# Patient Record
Sex: Male | Born: 1950 | Race: White | Hispanic: No | Marital: Single | Smoking: Current every day smoker
Health system: Southern US, Community
[De-identification: ages and names within clinical notes are randomized; demographics above are authoritative.]

## PROBLEM LIST (undated history)

## (undated) DIAGNOSIS — G4733 Obstructive sleep apnea (adult) (pediatric): Secondary | ICD-10-CM

## (undated) DIAGNOSIS — J45909 Unspecified asthma, uncomplicated: Secondary | ICD-10-CM

## (undated) DIAGNOSIS — N529 Male erectile dysfunction, unspecified: Secondary | ICD-10-CM

## (undated) DIAGNOSIS — M4800 Spinal stenosis, site unspecified: Secondary | ICD-10-CM

## (undated) DIAGNOSIS — I10 Essential (primary) hypertension: Secondary | ICD-10-CM

## (undated) DIAGNOSIS — M1990 Unspecified osteoarthritis, unspecified site: Secondary | ICD-10-CM

## (undated) DIAGNOSIS — F329 Major depressive disorder, single episode, unspecified: Secondary | ICD-10-CM

## (undated) DIAGNOSIS — F32A Depression, unspecified: Secondary | ICD-10-CM

## (undated) HISTORY — PX: INGUINAL HERNIA REPAIR: SUR1180

## (undated) HISTORY — DX: Depression, unspecified: F32.A

## (undated) HISTORY — DX: Spinal stenosis, site unspecified: M48.00

## (undated) HISTORY — DX: Male erectile dysfunction, unspecified: N52.9

## (undated) HISTORY — DX: Unspecified asthma, uncomplicated: J45.909

## (undated) HISTORY — DX: Obstructive sleep apnea (adult) (pediatric): G47.33

## (undated) HISTORY — DX: Essential (primary) hypertension: I10

## (undated) HISTORY — DX: Major depressive disorder, single episode, unspecified: F32.9

## (undated) HISTORY — PX: APPENDECTOMY: SHX54

---

## 2009-11-30 ENCOUNTER — Ambulatory Visit (HOSPITAL_COMMUNITY): Admission: RE | Admit: 2009-11-30 | Discharge: 2009-11-30 | Payer: Self-pay | Admitting: Family Medicine

## 2013-01-14 ENCOUNTER — Encounter: Payer: Self-pay | Admitting: Cardiology

## 2013-01-14 ENCOUNTER — Encounter: Payer: Self-pay | Admitting: *Deleted

## 2013-01-14 DIAGNOSIS — J45909 Unspecified asthma, uncomplicated: Secondary | ICD-10-CM | POA: Insufficient documentation

## 2013-01-14 DIAGNOSIS — G4733 Obstructive sleep apnea (adult) (pediatric): Secondary | ICD-10-CM | POA: Insufficient documentation

## 2013-01-14 DIAGNOSIS — M4800 Spinal stenosis, site unspecified: Secondary | ICD-10-CM | POA: Insufficient documentation

## 2013-01-14 DIAGNOSIS — N529 Male erectile dysfunction, unspecified: Secondary | ICD-10-CM | POA: Insufficient documentation

## 2013-01-14 DIAGNOSIS — R195 Other fecal abnormalities: Secondary | ICD-10-CM | POA: Insufficient documentation

## 2013-01-14 DIAGNOSIS — F329 Major depressive disorder, single episode, unspecified: Secondary | ICD-10-CM | POA: Insufficient documentation

## 2013-01-14 DIAGNOSIS — I10 Essential (primary) hypertension: Secondary | ICD-10-CM | POA: Insufficient documentation

## 2013-01-15 ENCOUNTER — Encounter: Payer: Self-pay | Admitting: Cardiology

## 2013-01-15 ENCOUNTER — Encounter (INDEPENDENT_AMBULATORY_CARE_PROVIDER_SITE_OTHER): Payer: Self-pay

## 2013-01-15 ENCOUNTER — Ambulatory Visit (INDEPENDENT_AMBULATORY_CARE_PROVIDER_SITE_OTHER): Payer: BC Managed Care – PPO | Admitting: Cardiology

## 2013-01-15 VITALS — BP 134/90 | HR 82 | Ht 72.0 in | Wt 242.0 lb

## 2013-01-15 DIAGNOSIS — G4733 Obstructive sleep apnea (adult) (pediatric): Secondary | ICD-10-CM

## 2013-01-15 DIAGNOSIS — I10 Essential (primary) hypertension: Secondary | ICD-10-CM

## 2013-01-15 DIAGNOSIS — E669 Obesity, unspecified: Secondary | ICD-10-CM

## 2013-01-15 NOTE — Patient Instructions (Signed)
Your physician wants you to follow-up in: 6 months with Dr. Turner. You will receive a reminder letter in the mail two months in advance. If you don't receive a letter, please call our office to schedule the follow-up appointment.  

## 2013-01-15 NOTE — Progress Notes (Signed)
  [address] [place], [place]  [address] Phone: [phone] Fax:  [phone]  Date:  01/15/2013   ID:  Kevin Bartlett, DOB August 27, 1950, MRN [id number]  PCP:  Thora Lance, MD  Cardiologist:  Armanda Magic, MD     History of Present Illness: Kevin Bartlett is a 62 y.o. male with a history of HTN and obesity who recently complained to his PCP that he was having problems with loud snoring and witnessed apnea and daytime sleepiness.  He underwent PSG showing severe OSA with an AHI of 37/hr and underwent CPAP titration to 8cm H2O.   His last download showed an AHI of 0.7/hr and 97% compliant in using more than 4 hours nightly.  He is doing very well with his CPAP therapy.  He tolerates the mask and pressure well.  He uses a nasal pillow mask.  He feels the pressure is adequate.  Since going on the CPAP he has had some improvement in his snoring and apnea and daytime sleepiness.     Wt Readings from Last 3 Encounters:  01/15/13 242 lb (109.77 kg)     Past Medical History  Diagnosis Date  . HTN (hypertension)   . Erectile dysfunction `  . OSA (obstructive sleep apnea)   . Guaiac positive stools   . Asthma   . Depression   . Spinal stenosis   . Obesity (BMI 30-39.9)     Current Outpatient Prescriptions  Medication Sig Dispense Refill  . amitriptyline (ELAVIL) 75 MG tablet Take by mouth at bedtime.      Marland Kitchen aspirin 81 MG tablet Take 81 mg by mouth daily.      Marland Kitchen diltiazem (TIAZAC) 180 MG 24 hr capsule Take 180 mg by mouth daily.      Marland Kitchen HYDROcodone-acetaminophen (NORCO/VICODIN) 5-325 MG per tablet Take 1 tablet by mouth every 6 (six) hours as needed for pain.       No current facility-administered medications for this visit.    Allergies:   Allergies not on file  Social History:  The patient  reports that he quit smoking about 10 years ago. He does not have any smokeless tobacco history on file. He reports that he does not use illicit drugs.   Family History:  The  patient's family history includes Heart disease in his father.   ROS:  Please see the history of present illness.      All other systems reviewed and negative.   PHYSICAL EXAM: VS:  Ht 6' (1.829 m)  Wt 242 lb (109.77 kg)  BMI 32.81 kg/m2 Well nourished, well developed, in no acute distress HEENT: normal Neck: no JVD Cardiac:  normal S1, S2; RRR; no murmur Lungs:  clear to auscultation bilaterally, no wheezing, rhonchi or rales Abd: soft, nontender, no hepatomegaly Ext: no edema Skin: warm and dry Neuro:  CNs 2-12 intact, no focal abnormalities noted       ASSESSMENT AND PLAN:  1. Severe OSA on CPAP tolerating well with improvement in symptoms of daytime fatigue and snoring.  - continue CPAP at current settings 2. Obesity  - I have encouraged him to exercise daily for at least 30 minutes and watch his portion controls and follow a low fat low carb diet 3. HTN  - continue Quinapril and Diltiazem   Followup with me in 6 months  Signed, Armanda Magic, MD 01/15/2013 10:36 AM

## 2013-06-30 ENCOUNTER — Encounter: Payer: Self-pay | Admitting: Cardiology

## 2013-07-07 ENCOUNTER — Encounter: Payer: Self-pay | Admitting: Cardiology

## 2015-05-02 DIAGNOSIS — I10 Essential (primary) hypertension: Secondary | ICD-10-CM | POA: Diagnosis not present

## 2015-05-02 DIAGNOSIS — Z1389 Encounter for screening for other disorder: Secondary | ICD-10-CM | POA: Diagnosis not present

## 2015-05-02 DIAGNOSIS — G4733 Obstructive sleep apnea (adult) (pediatric): Secondary | ICD-10-CM | POA: Diagnosis not present

## 2015-05-02 DIAGNOSIS — E781 Pure hyperglyceridemia: Secondary | ICD-10-CM | POA: Diagnosis not present

## 2015-05-02 DIAGNOSIS — Z23 Encounter for immunization: Secondary | ICD-10-CM | POA: Diagnosis not present

## 2015-05-02 DIAGNOSIS — Z720 Tobacco use: Secondary | ICD-10-CM | POA: Diagnosis not present

## 2015-05-02 DIAGNOSIS — M5416 Radiculopathy, lumbar region: Secondary | ICD-10-CM | POA: Diagnosis not present

## 2015-05-02 DIAGNOSIS — N529 Male erectile dysfunction, unspecified: Secondary | ICD-10-CM | POA: Diagnosis not present

## 2015-07-18 DIAGNOSIS — M792 Neuralgia and neuritis, unspecified: Secondary | ICD-10-CM | POA: Diagnosis not present

## 2015-07-20 DIAGNOSIS — G5600 Carpal tunnel syndrome, unspecified upper limb: Secondary | ICD-10-CM | POA: Diagnosis not present

## 2015-07-20 DIAGNOSIS — G629 Polyneuropathy, unspecified: Secondary | ICD-10-CM | POA: Diagnosis not present

## 2015-07-27 DIAGNOSIS — M19042 Primary osteoarthritis, left hand: Secondary | ICD-10-CM | POA: Diagnosis not present

## 2015-07-27 DIAGNOSIS — M19041 Primary osteoarthritis, right hand: Secondary | ICD-10-CM | POA: Diagnosis not present

## 2015-07-27 DIAGNOSIS — M720 Palmar fascial fibromatosis [Dupuytren]: Secondary | ICD-10-CM | POA: Diagnosis not present

## 2015-07-27 DIAGNOSIS — G5603 Carpal tunnel syndrome, bilateral upper limbs: Secondary | ICD-10-CM | POA: Diagnosis not present

## 2015-07-27 DIAGNOSIS — M180 Bilateral primary osteoarthritis of first carpometacarpal joints: Secondary | ICD-10-CM | POA: Diagnosis not present

## 2015-08-10 DIAGNOSIS — M19041 Primary osteoarthritis, right hand: Secondary | ICD-10-CM | POA: Diagnosis not present

## 2015-08-10 DIAGNOSIS — M19042 Primary osteoarthritis, left hand: Secondary | ICD-10-CM | POA: Diagnosis not present

## 2015-08-10 DIAGNOSIS — M180 Bilateral primary osteoarthritis of first carpometacarpal joints: Secondary | ICD-10-CM | POA: Diagnosis not present

## 2015-08-10 DIAGNOSIS — M720 Palmar fascial fibromatosis [Dupuytren]: Secondary | ICD-10-CM | POA: Diagnosis not present

## 2015-08-10 DIAGNOSIS — G5603 Carpal tunnel syndrome, bilateral upper limbs: Secondary | ICD-10-CM | POA: Diagnosis not present

## 2015-09-12 DIAGNOSIS — G5603 Carpal tunnel syndrome, bilateral upper limbs: Secondary | ICD-10-CM | POA: Diagnosis not present

## 2015-09-12 DIAGNOSIS — M720 Palmar fascial fibromatosis [Dupuytren]: Secondary | ICD-10-CM | POA: Diagnosis not present

## 2015-10-31 DIAGNOSIS — M5416 Radiculopathy, lumbar region: Secondary | ICD-10-CM | POA: Diagnosis not present

## 2015-10-31 DIAGNOSIS — R634 Abnormal weight loss: Secondary | ICD-10-CM | POA: Diagnosis not present

## 2015-10-31 DIAGNOSIS — Z720 Tobacco use: Secondary | ICD-10-CM | POA: Diagnosis not present

## 2015-10-31 DIAGNOSIS — G4733 Obstructive sleep apnea (adult) (pediatric): Secondary | ICD-10-CM | POA: Diagnosis not present

## 2015-10-31 DIAGNOSIS — E781 Pure hyperglyceridemia: Secondary | ICD-10-CM | POA: Diagnosis not present

## 2015-10-31 DIAGNOSIS — N529 Male erectile dysfunction, unspecified: Secondary | ICD-10-CM | POA: Diagnosis not present

## 2015-10-31 DIAGNOSIS — I10 Essential (primary) hypertension: Secondary | ICD-10-CM | POA: Diagnosis not present

## 2015-12-20 ENCOUNTER — Encounter: Payer: Self-pay | Admitting: Cardiology

## 2015-12-21 ENCOUNTER — Encounter: Payer: Self-pay | Admitting: Cardiology

## 2015-12-21 ENCOUNTER — Ambulatory Visit (INDEPENDENT_AMBULATORY_CARE_PROVIDER_SITE_OTHER): Payer: Medicare Other | Admitting: Cardiology

## 2015-12-21 VITALS — BP 122/64 | HR 76 | Ht 72.0 in | Wt 194.6 lb

## 2015-12-21 DIAGNOSIS — I10 Essential (primary) hypertension: Secondary | ICD-10-CM

## 2015-12-21 DIAGNOSIS — G4733 Obstructive sleep apnea (adult) (pediatric): Secondary | ICD-10-CM

## 2015-12-21 DIAGNOSIS — E669 Obesity, unspecified: Secondary | ICD-10-CM

## 2015-12-21 NOTE — Progress Notes (Signed)
Cardiology Office Note    Date:  12/21/2015   ID:  [name], DOB 1950/08/23, MRN [id number]  PCP:  Thora Lance, MD  Cardiologist:  Armanda Magic, MD   Chief Complaint  Patient presents with  . Sleep Apnea  . Hypertension    History of Present Illness:  Kevin Bartlett is a 65 y.o. male with a history of HTN, OSA on CPAP and obesity.  He has severe OSA with an AHI of 37/hr and is on CPAP at 8cm H2O.   He is doing very well with his CPAP therapy.  He tolerates the nasal pillow mask and pressure well.   He feels the pressure is adequate.  Since going on the CPAP he has had some improvement in his snoring and apnea and daytime sleepiness.  He has not been very compliant with his device.  When he goes on trips he does not take it with him.      Past Medical History:  Diagnosis Date  . Asthma   . Depression   . Erectile dysfunction `  . Guaiac positive stools   . HTN (hypertension)   . Obesity (BMI 30-39.9)   . OSA (obstructive sleep apnea)   . Spinal stenosis     Past Surgical History:  Procedure Laterality Date  . APPENDECTOMY    . INGUINAL HERNIA REPAIR Bilateral     Current Medications: Outpatient Medications Prior to Visit  Medication Sig Dispense Refill  . amitriptyline (ELAVIL) 75 MG tablet Take by mouth at bedtime.    Marland Kitchen aspirin 81 MG tablet Take 81 mg by mouth daily.    Marland Kitchen diltiazem (TIAZAC) 180 MG 24 hr capsule Take 180 mg by mouth daily.    Marland Kitchen HYDROcodone-acetaminophen (NORCO/VICODIN) 5-325 MG per tablet Take 1 tablet by mouth every 6 (six) hours as needed for pain.    . meloxicam (MOBIC) 7.5 MG tablet Take 7.5 mg by mouth daily.    . quinapril (ACCUPRIL) 10 MG tablet Take 10 mg by mouth at bedtime.     No facility-administered medications prior to visit.      Allergies:   Review of patient's allergies indicates no known allergies.   Social History   Social History  . Marital status: Single    Spouse name: N/A  . Number of children: N/A  .  Years of education: N/A   Social History Main Topics  . Smoking status: Former Smoker    Types: Cigars    Quit date: 01/16/2003  . Smokeless tobacco: Never Used  . Alcohol use None  . Drug use: No  . Sexual activity: Not Asked   Other Topics Concern  . None   Social History Narrative  . None     Family History:  The patient's family history includes Heart disease in his father.   ROS:   Please see the history of present illness.    ROS All other systems reviewed and are negative.  No flowsheet data found.     PHYSICAL EXAM:   VS:  BP 122/64   Pulse 76   Ht 6' (1.829 m)   Wt 194 lb 9.6 oz (88.3 kg)   SpO2 98%   BMI 26.39 kg/m    GEN: Well nourished, well developed, in no acute distress  HEENT: normal  Neck: no JVD, carotid bruits, or masses Cardiac: RRR; no murmurs, rubs, or gallops,no edema.  Intact distal pulses bilaterally.  Respiratory:  clear to auscultation bilaterally, normal work of breathing  GI: soft, nontender, nondistended, + BS MS: no deformity or atrophy  Skin: warm and dry, no rash Neuro:  Alert and Oriented x 3, Strength and sensation are intact Psych: euthymic mood, full affect  Wt Readings from Last 3 Encounters:  12/21/15 194 lb 9.6 oz (88.3 kg)  01/15/13 242 lb (109.8 kg)      Studies/Labs Reviewed:   EKG:  EKG is not ordered today.    Recent Labs: No results found for requested labs within last 8760 hours.   Lipid Panel No results found for: CHOL, TRIG, HDL, CHOLHDL, VLDL, LDLCALC, LDLDIRECT  Additional studies/ records that were reviewed today include:  CPAP download    ASSESSMENT:    1. OSA (obstructive sleep apnea)   2. Essential hypertension   3. Obesity (BMI 30-39.9)      PLAN:  In order of problems listed above:  OSA - the patient is tolerating PAP therapy well without any problems. The PAP download was reviewed today and showed an AHI of 0.3/hr on 8 cm H2O with 59% compliance in using more than 4 hours nightly.   The patient has been using and benefiting from CPAP use and will continue to benefit from therapy. I have encouraged him to be more compliant with his device. He has lost over 30lbs so I will get a 2 week autotitration from 4 to 18cm H2o and see if we can decrease his pressure. HTN - BP controlled on current meds.  Continue ACE I/CCB.   Obesity - I have encouraged him to continue exercising at the gym.  He has lost over 30lbs since he      Medication Adjustments/Labs and Tests Ordered: Current medicines are reviewed at length with the patient today.  Concerns regarding medicines are outlined above.  Medication changes, Labs and Tests ordered today are listed in the Patient Instructions below.  There are no Patient Instructions on file for this visit.   Signed, Armanda Magicraci Annah Jasko, MD  12/21/2015 3:53 PM    [facility] [address], [place], [place]  [address] Phone: [phone]; Fax: [phone]

## 2015-12-21 NOTE — Patient Instructions (Signed)
Medication Instructions:  Your physician recommends that you continue on your current medications as directed. Please refer to the Current Medication list given to you today.   Labwork: None  Testing/Procedures: Dr. Mayford Knifeurner has ordered for you a 2 week CPAP auto-titration.  Follow-Up: Your physician wants you to follow-up in: 1 year with Dr. Mayford Knifeurner. You will receive a reminder letter in the mail two months in advance. If you don't receive a letter, please call our office to schedule the follow-up appointment.   Any Other Special Instructions Will Be Listed Below (If Applicable).     If you need a refill on your cardiac medications before your next appointment, please call your pharmacy.

## 2016-03-14 ENCOUNTER — Other Ambulatory Visit: Payer: Self-pay | Admitting: Orthopedic Surgery

## 2016-03-14 DIAGNOSIS — M720 Palmar fascial fibromatosis [Dupuytren]: Secondary | ICD-10-CM | POA: Diagnosis not present

## 2016-03-14 DIAGNOSIS — G5603 Carpal tunnel syndrome, bilateral upper limbs: Secondary | ICD-10-CM | POA: Diagnosis not present

## 2016-03-20 ENCOUNTER — Encounter (HOSPITAL_BASED_OUTPATIENT_CLINIC_OR_DEPARTMENT_OTHER): Payer: Self-pay | Admitting: *Deleted

## 2016-03-20 ENCOUNTER — Encounter (HOSPITAL_BASED_OUTPATIENT_CLINIC_OR_DEPARTMENT_OTHER)
Admission: RE | Admit: 2016-03-20 | Discharge: 2016-03-20 | Disposition: A | Discharge status: Home / Self Care | Payer: Medicare Other | Source: Ambulatory Visit | Attending: Orthopedic Surgery | Admitting: Orthopedic Surgery

## 2016-03-20 DIAGNOSIS — G4733 Obstructive sleep apnea (adult) (pediatric): Secondary | ICD-10-CM | POA: Diagnosis not present

## 2016-03-20 DIAGNOSIS — Z79899 Other long term (current) drug therapy: Secondary | ICD-10-CM | POA: Diagnosis not present

## 2016-03-20 DIAGNOSIS — I10 Essential (primary) hypertension: Secondary | ICD-10-CM | POA: Diagnosis not present

## 2016-03-20 DIAGNOSIS — J45909 Unspecified asthma, uncomplicated: Secondary | ICD-10-CM | POA: Diagnosis not present

## 2016-03-20 DIAGNOSIS — Z6827 Body mass index (BMI) 27.0-27.9, adult: Secondary | ICD-10-CM | POA: Diagnosis not present

## 2016-03-20 DIAGNOSIS — Z87891 Personal history of nicotine dependence: Secondary | ICD-10-CM | POA: Diagnosis not present

## 2016-03-20 DIAGNOSIS — M720 Palmar fascial fibromatosis [Dupuytren]: Secondary | ICD-10-CM | POA: Diagnosis not present

## 2016-03-20 DIAGNOSIS — E669 Obesity, unspecified: Secondary | ICD-10-CM | POA: Diagnosis not present

## 2016-03-20 DIAGNOSIS — G5603 Carpal tunnel syndrome, bilateral upper limbs: Secondary | ICD-10-CM | POA: Diagnosis not present

## 2016-03-20 DIAGNOSIS — F329 Major depressive disorder, single episode, unspecified: Secondary | ICD-10-CM | POA: Diagnosis not present

## 2016-03-20 NOTE — Progress Notes (Signed)
   03/20/16 1006  OBSTRUCTIVE SLEEP APNEA  Have you ever been diagnosed with sleep apnea through a sleep study? Yes  If yes, do you have and use a CPAP or BPAP machine every night? 1  Do you know the presssure settings on your maching? Yes  Do you snore loudly (loud enough to be heard through closed doors)?  1  Do you often feel tired, fatigued, or sleepy during the daytime (such as falling asleep during driving or talking to someone)? 0  Has anyone observed you stop breathing during your sleep? 0  Do you have, or are you being treated for high blood pressure? 1  BMI more than 35 kg/m2? 0  Age > 50 (1-yes) 1  Neck circumference greater than:Male 16 inches or larger, Male 17inches or larger? 0  Male Gender (Yes=1) 1  Obstructive Sleep Apnea Score 4

## 2016-03-22 ENCOUNTER — Ambulatory Visit (HOSPITAL_BASED_OUTPATIENT_CLINIC_OR_DEPARTMENT_OTHER): Payer: Medicare Other | Admitting: Anesthesiology

## 2016-03-22 ENCOUNTER — Encounter (HOSPITAL_BASED_OUTPATIENT_CLINIC_OR_DEPARTMENT_OTHER)
Admission: RE | Disposition: A | Discharge status: Home / Self Care | Payer: Self-pay | Source: Ambulatory Visit | Attending: Orthopedic Surgery

## 2016-03-22 ENCOUNTER — Encounter (HOSPITAL_BASED_OUTPATIENT_CLINIC_OR_DEPARTMENT_OTHER): Payer: Self-pay | Admitting: Anesthesiology

## 2016-03-22 ENCOUNTER — Ambulatory Visit (HOSPITAL_BASED_OUTPATIENT_CLINIC_OR_DEPARTMENT_OTHER)
Admission: RE | Admit: 2016-03-22 | Discharge: 2016-03-22 | Disposition: A | Discharge status: Home / Self Care | Payer: Medicare Other | Source: Ambulatory Visit | Attending: Orthopedic Surgery | Admitting: Orthopedic Surgery

## 2016-03-22 DIAGNOSIS — G4733 Obstructive sleep apnea (adult) (pediatric): Secondary | ICD-10-CM | POA: Insufficient documentation

## 2016-03-22 DIAGNOSIS — I10 Essential (primary) hypertension: Secondary | ICD-10-CM | POA: Diagnosis not present

## 2016-03-22 DIAGNOSIS — G5603 Carpal tunnel syndrome, bilateral upper limbs: Secondary | ICD-10-CM | POA: Insufficient documentation

## 2016-03-22 DIAGNOSIS — Z79899 Other long term (current) drug therapy: Secondary | ICD-10-CM | POA: Insufficient documentation

## 2016-03-22 DIAGNOSIS — J45909 Unspecified asthma, uncomplicated: Secondary | ICD-10-CM | POA: Diagnosis not present

## 2016-03-22 DIAGNOSIS — Z6827 Body mass index (BMI) 27.0-27.9, adult: Secondary | ICD-10-CM | POA: Diagnosis not present

## 2016-03-22 DIAGNOSIS — M720 Palmar fascial fibromatosis [Dupuytren]: Secondary | ICD-10-CM | POA: Insufficient documentation

## 2016-03-22 DIAGNOSIS — E669 Obesity, unspecified: Secondary | ICD-10-CM | POA: Insufficient documentation

## 2016-03-22 DIAGNOSIS — Z87891 Personal history of nicotine dependence: Secondary | ICD-10-CM | POA: Insufficient documentation

## 2016-03-22 DIAGNOSIS — G5602 Carpal tunnel syndrome, left upper limb: Secondary | ICD-10-CM | POA: Diagnosis not present

## 2016-03-22 DIAGNOSIS — F329 Major depressive disorder, single episode, unspecified: Secondary | ICD-10-CM | POA: Diagnosis not present

## 2016-03-22 HISTORY — PX: CARPAL TUNNEL RELEASE: SHX101

## 2016-03-22 HISTORY — PX: FASCIECTOMY: SHX6525

## 2016-03-22 HISTORY — DX: Unspecified osteoarthritis, unspecified site: M19.90

## 2016-03-22 SURGERY — CARPAL TUNNEL RELEASE
Anesthesia: General | Site: Wrist | Laterality: Left

## 2016-03-22 MED ORDER — BUPIVACAINE HCL (PF) 0.25 % IJ SOLN
INTRAMUSCULAR | Status: DC | PRN
  Administered 2016-03-22: 10 mL

## 2016-03-22 MED ORDER — PROMETHAZINE HCL 25 MG/ML IJ SOLN: 6.2500 mg | INTRAMUSCULAR | Status: DC | PRN

## 2016-03-22 MED ORDER — FENTANYL CITRATE (PF) 100 MCG/2ML IJ SOLN
INTRAMUSCULAR | Status: DC | PRN
  Administered 2016-03-22: 50 ug via INTRAVENOUS
  Administered 2016-03-22: 100 ug via INTRAVENOUS
  Administered 2016-03-22: 50 ug via INTRAVENOUS

## 2016-03-22 MED ORDER — PROPOFOL 10 MG/ML IV BOLUS
INTRAVENOUS | Status: AC
  Filled 2016-03-22: qty 20

## 2016-03-22 MED ORDER — LIDOCAINE 2% (20 MG/ML) 5 ML SYRINGE
INTRAMUSCULAR | Status: AC
  Filled 2016-03-22: qty 5

## 2016-03-22 MED ORDER — LIDOCAINE HCL (CARDIAC) 20 MG/ML IV SOLN
INTRAVENOUS | Status: DC | PRN
  Administered 2016-03-22: 30 mg via INTRAVENOUS

## 2016-03-22 MED ORDER — PROPOFOL 10 MG/ML IV BOLUS
INTRAVENOUS | Status: DC | PRN
  Administered 2016-03-22: 200 mg via INTRAVENOUS

## 2016-03-22 MED ORDER — MIDAZOLAM HCL 5 MG/5ML IJ SOLN
INTRAMUSCULAR | Status: DC | PRN
  Administered 2016-03-22: 2 mg via INTRAVENOUS

## 2016-03-22 MED ORDER — ONDANSETRON HCL 4 MG/2ML IJ SOLN
INTRAMUSCULAR | Status: DC | PRN
  Administered 2016-03-22: 4 mg via INTRAVENOUS

## 2016-03-22 MED ORDER — LACTATED RINGERS IV SOLN
INTRAVENOUS | Status: DC
  Administered 2016-03-22: 08:00:00 via INTRAVENOUS

## 2016-03-22 MED ORDER — CEFAZOLIN SODIUM-DEXTROSE 2-4 GM/100ML-% IV SOLN
INTRAVENOUS | Status: AC
  Filled 2016-03-22: qty 100

## 2016-03-22 MED ORDER — CEFAZOLIN SODIUM-DEXTROSE 2-4 GM/100ML-% IV SOLN
2.0000 g | INTRAVENOUS | Status: AC
  Administered 2016-03-22: 2 g via INTRAVENOUS

## 2016-03-22 MED ORDER — EPHEDRINE SULFATE 50 MG/ML IJ SOLN
INTRAMUSCULAR | Status: DC | PRN
  Administered 2016-03-22: 10 mg via INTRAVENOUS

## 2016-03-22 MED ORDER — DEXAMETHASONE SODIUM PHOSPHATE 10 MG/ML IJ SOLN
INTRAMUSCULAR | Status: AC
  Filled 2016-03-22: qty 1

## 2016-03-22 MED ORDER — LIDOCAINE HCL (PF) 0.5 % IJ SOLN
INTRAMUSCULAR | Status: AC
  Filled 2016-03-22: qty 100

## 2016-03-22 MED ORDER — MIDAZOLAM HCL 2 MG/2ML IJ SOLN: 1.0000 mg | INTRAMUSCULAR | Status: DC | PRN

## 2016-03-22 MED ORDER — KETOROLAC TROMETHAMINE 30 MG/ML IJ SOLN
INTRAMUSCULAR | Status: DC | PRN
  Administered 2016-03-22: 30 mg via INTRAVENOUS

## 2016-03-22 MED ORDER — FENTANYL CITRATE (PF) 100 MCG/2ML IJ SOLN
INTRAMUSCULAR | Status: AC
  Filled 2016-03-22: qty 2

## 2016-03-22 MED ORDER — PHENYLEPHRINE HCL 10 MG/ML IJ SOLN
INTRAMUSCULAR | Status: DC | PRN
  Administered 2016-03-22: 40 ug via INTRAVENOUS

## 2016-03-22 MED ORDER — FENTANYL CITRATE (PF) 100 MCG/2ML IJ SOLN: 50.0000 ug | INTRAMUSCULAR | Status: DC | PRN

## 2016-03-22 MED ORDER — DEXAMETHASONE SODIUM PHOSPHATE 4 MG/ML IJ SOLN
INTRAMUSCULAR | Status: DC | PRN
  Administered 2016-03-22: 10 mg via INTRAVENOUS

## 2016-03-22 MED ORDER — MIDAZOLAM HCL 2 MG/2ML IJ SOLN
INTRAMUSCULAR | Status: AC
  Filled 2016-03-22: qty 2

## 2016-03-22 MED ORDER — THROMBIN 20000 UNITS EX KIT
PACK | CUTANEOUS | Status: DC | PRN
  Administered 2016-03-22: 5000 [IU] via TOPICAL

## 2016-03-22 MED ORDER — CHLORHEXIDINE GLUCONATE 4 % EX LIQD: 60.0000 mL | Freq: Once | CUTANEOUS | Status: DC

## 2016-03-22 MED ORDER — HYDROMORPHONE HCL 1 MG/ML IJ SOLN: 0.2500 mg | INTRAMUSCULAR | Status: DC | PRN

## 2016-03-22 MED ORDER — PROPOFOL 500 MG/50ML IV EMUL
INTRAVENOUS | Status: AC
  Filled 2016-03-22: qty 50

## 2016-03-22 MED ORDER — ONDANSETRON HCL 4 MG/2ML IJ SOLN
INTRAMUSCULAR | Status: AC
  Filled 2016-03-22: qty 2

## 2016-03-22 MED ORDER — SCOPOLAMINE 1 MG/3DAYS TD PT72
1.0000 | MEDICATED_PATCH | Freq: Once | TRANSDERMAL | Status: DC | PRN
Start: 2016-03-22 — End: 2016-03-22

## 2016-03-22 MED ORDER — LACTATED RINGERS IV SOLN
INTRAVENOUS | Status: DC | PRN
  Administered 2016-03-22 (×2): via INTRAVENOUS

## 2016-03-22 MED ORDER — KETOROLAC TROMETHAMINE 30 MG/ML IJ SOLN
INTRAMUSCULAR | Status: AC
  Filled 2016-03-22: qty 1

## 2016-03-22 SURGICAL SUPPLY — 44 items
BLADE MINI RND TIP GREEN BEAV (BLADE) ×4 IMPLANT
BLADE SURG 15 STRL LF DISP TIS (BLADE) ×2 IMPLANT
BLADE SURG 15 STRL SS (BLADE) ×2
BNDG COHESIVE 3X5 TAN STRL LF (GAUZE/BANDAGES/DRESSINGS) ×4 IMPLANT
BNDG ESMARK 4X9 LF (GAUZE/BANDAGES/DRESSINGS) ×4 IMPLANT
BNDG GAUZE ELAST 4 BULKY (GAUZE/BANDAGES/DRESSINGS) ×4 IMPLANT
CHLORAPREP W/TINT 26ML (MISCELLANEOUS) ×4 IMPLANT
CORDS BIPOLAR (ELECTRODE) ×4 IMPLANT
COVER BACK TABLE 60X90IN (DRAPES) ×4 IMPLANT
COVER MAYO STAND STRL (DRAPES) ×4 IMPLANT
CUFF TOURNIQUET SINGLE 18IN (TOURNIQUET CUFF) ×4 IMPLANT
DECANTER SPIKE VIAL GLASS SM (MISCELLANEOUS) IMPLANT
DRAPE EXTREMITY T 121X128X90 (DRAPE) ×4 IMPLANT
DRAPE SURG 17X23 STRL (DRAPES) ×4 IMPLANT
DRSG PAD ABDOMINAL 8X10 ST (GAUZE/BANDAGES/DRESSINGS) ×4 IMPLANT
GAUZE SPONGE 4X4 12PLY STRL (GAUZE/BANDAGES/DRESSINGS) ×4 IMPLANT
GAUZE XEROFORM 1X8 LF (GAUZE/BANDAGES/DRESSINGS) ×4 IMPLANT
GLOVE BIOGEL PI IND STRL 7.0 (GLOVE) ×2 IMPLANT
GLOVE BIOGEL PI IND STRL 8.5 (GLOVE) ×2 IMPLANT
GLOVE BIOGEL PI INDICATOR 7.0 (GLOVE) ×2
GLOVE BIOGEL PI INDICATOR 8.5 (GLOVE) ×2
GLOVE SURG ORTHO 8.0 STRL STRW (GLOVE) ×4 IMPLANT
GLOVE SURG SYN 7.5  E (GLOVE) ×2
GLOVE SURG SYN 7.5 E (GLOVE) ×2 IMPLANT
GOWN STRL REUS W/ TWL LRG LVL3 (GOWN DISPOSABLE) ×2 IMPLANT
GOWN STRL REUS W/TWL LRG LVL3 (GOWN DISPOSABLE) ×2
GOWN STRL REUS W/TWL XL LVL3 (GOWN DISPOSABLE) ×4 IMPLANT
LOOP VESSEL MAXI BLUE (MISCELLANEOUS) ×4 IMPLANT
NEEDLE PRECISIONGLIDE 27X1.5 (NEEDLE) ×4 IMPLANT
NS IRRIG 1000ML POUR BTL (IV SOLUTION) ×4 IMPLANT
PACK BASIN DAY SURGERY FS (CUSTOM PROCEDURE TRAY) ×4 IMPLANT
PAD CAST 3X4 CTTN HI CHSV (CAST SUPPLIES) ×2 IMPLANT
PADDING CAST COTTON 3X4 STRL (CAST SUPPLIES) ×2
SLEEVE SCD COMPRESS KNEE MED (MISCELLANEOUS) ×4 IMPLANT
SPLINT PLASTER CAST XFAST 3X15 (CAST SUPPLIES) IMPLANT
SPLINT PLASTER XTRA FASTSET 3X (CAST SUPPLIES)
STOCKINETTE 4X48 STRL (DRAPES) ×4 IMPLANT
SUT ETHILON 4 0 PS 2 18 (SUTURE) ×4 IMPLANT
SUT SILK 2 0 FS (SUTURE) ×4 IMPLANT
SUT VICRYL 4-0 PS2 18IN ABS (SUTURE) IMPLANT
SYR BULB 3OZ (MISCELLANEOUS) ×4 IMPLANT
SYR CONTROL 10ML LL (SYRINGE) ×4 IMPLANT
TOWEL OR 17X24 6PK STRL BLUE (TOWEL DISPOSABLE) ×8 IMPLANT
UNDERPAD 30X30 (UNDERPADS AND DIAPERS) ×4 IMPLANT

## 2016-03-22 NOTE — Transfer of Care (Signed)
Immediate Anesthesia Transfer of Care Note  Patient: Kevin Bartlett  Procedure(s) Performed: Procedure(s) with comments: LEFT CARPAL TUNNEL RELEASE FASCIECTOMY LEFT RING FINGER (Left) - AXILLARY BLOCK FASCIECTOMY LEFT RING FINGER (Left)  Patient Location: PACU  Anesthesia Type:General  Level of Consciousness: awake  Airway & Oxygen Therapy: Patient Spontanous Breathing and Patient connected to face mask oxygen  Post-op Assessment: Report given to RN and Post -op Vital signs reviewed and stable  Post vital signs: Reviewed and stable  Last Vitals:  Vitals:   03/22/16 0733  BP: 124/67  Pulse: 62  Resp: 18  Temp: 36.4 C    Last Pain:  Vitals:   03/22/16 0733  TempSrc: Oral      Patients Stated Pain Goal: 0 (03/22/16 0733)  Complications: No apparent anesthesia complications

## 2016-03-22 NOTE — Op Note (Signed)
NAME:  Kevin Bartlett, Kevin Bartlett NO.:  [id number]  MEDICAL RECORD NO.:  [id number]  LOCATION:                                 FACILITY:  PHYSICIAN:  Cindee Salt, M.D.            [name]  DATE OF PROCEDURE:  03/22/2016 DATE OF DISCHARGE:                              OPERATIVE REPORT   PREOPERATIVE DIAGNOSES:  Dupuytren contracture, left ring finger and carpal tunnel syndrome, left hand.  POSTOPERATIVE DIAGNOSES:  Dupuytren contracture, left ring finger and carpal tunnel syndrome, left hand.  OPERATION:  Decompression median nerve, left carpal canal.  Fasciectomy left ring finger with V-Y advancements.  SURGEON:  Cindee Salt, MD.  ANESTHESIA:  General with local infiltration.  ANESTHESIOLOGIST:  Quita Skye. [name], M.D.  HISTORY:  The patient is a 66 year old male with a history of Dupuytren contracture to the left ring finger and carpal tunnel syndrome.  EMG nerve conduction is positive.  He has elected to undergo surgical release of the median nerve and excision of the palmar fascia cord of his left ring finger.  Pre, peri, and postoperative course have been discussed along with risks and complications.  He is aware that there is no guarantee to the surgery, the possibility of infection, recurrence of injury to arteries, nerves, tendons, incomplete relief of symptoms and dystrophy.  In the preoperative area, the patient is seen, the extremity marked by both patient and surgeon.  Antibiotic given.  PROCEDURE IN DETAIL:  The patient was brought to the operating room, where general anesthetic was carried out without difficulty under the direction of the Anesthesia Department.  He was prepped using ChloraPrep in a supine position with left arm free.  A 3-minute dry time was allowed, and a time-out taken, confirming the patient and procedure.  A longitudinal incision was made in the left palm, carried down through subcutaneous tissue.  Bleeders were  electrocauterized with bipolar. Retractors were placed and superficial palmar arch was identified.  The flexor tendon to the ring and little finger was identified.  Retractors were placed pulling the median nerve radially and the ulnar nerve ulnarly.  The flexor retinaculum was then incised with sharp dissection on its ulnar border protecting two nerves.  A right angle and Sewall retractor were placed between skin and forearm fascia.  The nerve dissected from the dorsal aspect of the distal forearm fascia and the fascia was then released for approximately 2 cm proximal to the wrist crease under direct vision.  The canal was explored.  No further lesions were identified.  Air of compression to the nerve was apparent.  The motor branch entered into muscle distally.  The wound was copiously irrigated with saline, and the skin closed with interrupted 4-0 nylon sutures.  A separate incision was then made [name] in nature over the distal palm and ring finger to the PIP joint.  This was carried down through subcutaneous tissue.  The cord was identified proximally and dissected free.  Neurovascular bundles were identified.  The central cord was then traced distally releasing it to the level of the mid portion of the proximal phalanx.  The entire specimen was sent to  Pathology.  The nerves were traced along their entire course along with arteries and were found to be intact over the entire course.  The wound was irrigated with saline.  It was then instilled with thrombin.  The V's converted to Y's.  A doubled over vessel loop drain was placed in the depths of the wound, and the wound closed with interrupted 4-0 nylon sutures.  A sterile compressive dressing was applied.  The tourniquet deflated.  All fingers immediately pinked.  A dorsal splint was then applied along with the remainder of the dressing.  The patient was taken to the recovery room for observation in satisfactory condition.  He  will be discharged to home [facility] [place] [facility].  He already has Norco at home.          ______________________________ Cindee SaltGary Harm Jou, M.D.     GK/MEDQ  D:  03/22/2016  T:  03/22/2016  Job:  [id number]

## 2016-03-22 NOTE — Anesthesia Postprocedure Evaluation (Addendum)
Anesthesia Post Note  Patient: Kevin BihariRichard F Bartlett  Procedure(s) Performed: Procedure(s) (LRB): LEFT CARPAL TUNNEL RELEASE FASCIECTOMY LEFT RING FINGER (Left) FASCIECTOMY LEFT RING FINGER (Left)  Patient location during evaluation: PACU Anesthesia Type: General Level of consciousness: sedated Pain management: pain level controlled Vital Signs Assessment: post-procedure vital signs reviewed and stable Respiratory status: spontaneous breathing and respiratory function stable Cardiovascular status: stable Anesthetic complications: no       Last Vitals:  Vitals:   03/22/16 1033 03/22/16 1045  BP:  132/77  Pulse: 60   Resp: 18   Temp: 36.4 C     Last Pain:  Vitals:   03/22/16 1033  TempSrc: Oral  PainSc: 0-No pain                 Bevan Disney DANIEL

## 2016-03-22 NOTE — Anesthesia Preprocedure Evaluation (Addendum)
Anesthesia Evaluation  Patient identified by MRN, date of birth, ID band Patient awake    Reviewed: Allergy & Precautions, NPO status , Patient's Chart, lab work & pertinent test results  History of Anesthesia Complications Negative for: history of anesthetic complications  Airway Mallampati: II  TM Distance: >3 FB Neck ROM: Full    Dental  (+) Dental Advisory Given, Poor Dentition   Pulmonary sleep apnea and Continuous Positive Airway Pressure Ventilation , former smoker,    Pulmonary exam normal        Cardiovascular hypertension, Pt. on medications Normal cardiovascular exam     Neuro/Psych PSYCHIATRIC DISORDERS Depression negative neurological ROS     GI/Hepatic negative GI ROS, Neg liver ROS,   Endo/Other  negative endocrine ROS  Renal/GU negative Renal ROS     Musculoskeletal   Abdominal   Peds  Hematology   Anesthesia Other Findings   Reproductive/Obstetrics                            Anesthesia Physical Anesthesia Plan  ASA: II  Anesthesia Plan: General   Post-op Pain Management:    Induction: Intravenous  Airway Management Planned: LMA  Additional Equipment:   Intra-op Plan:   Post-operative Plan: Extubation in OR  Informed Consent: I have reviewed the patients History and Physical, chart, labs and discussed the procedure including the risks, benefits and alternatives for the proposed anesthesia with the patient or authorized representative who has indicated his/her understanding and acceptance.   Dental advisory given  Plan Discussed with: CRNA and Anesthesiologist  Anesthesia Plan Comments:         Anesthesia Quick Evaluation

## 2016-03-22 NOTE — Anesthesia Procedure Notes (Signed)
Procedure Name: LMA Insertion Date/Time: 03/22/2016 8:34 AM Performed by: Genevieve NorlanderLINKA, Kevin Bartlett Pre-anesthesia Checklist: Patient identified, Emergency Drugs available, Suction available, Patient being monitored and Timeout performed Patient Re-evaluated:Patient Re-evaluated prior to inductionOxygen Delivery Method: Circle system utilized Preoxygenation: Pre-oxygenation with 100% oxygen Intubation Type: IV induction Ventilation: Mask ventilation without difficulty LMA: LMA inserted LMA Size: 5.0 Number of attempts: 1 Airway Equipment and Method: Bite block Placement Confirmation: positive ETCO2 Tube secured with: Tape Dental Injury: Teeth and Oropharynx as per pre-operative assessment

## 2016-03-22 NOTE — Discharge Instructions (Addendum)
[facility] Center Instructions °Hand Surgery ° °Wound Care: °Keep your hand elevated above the level of your heart.  Do not allow it to dangle by your side.  Keep the dressing dry and do not remove it unless your doctor advises you to do so.  He will usually change it at the time of your post-op visit.  Moving your fingers is advised to stimulate circulation but will depend on the site of your surgery.  If you have a splint applied, your doctor will advise you regarding movement. ° °Activity: °Do not drive or operate machinery today.  Rest today and then you may return to your normal activity and work as indicated by your physician. ° °Diet:  °Drink liquids today or eat a light diet.  You may resume a regular diet tomorrow.   ° °General expectations: °Pain for two to three days. °Fingers may become slightly swollen. ° °Call your doctor if any of the following occur: °Severe pain not relieved by pain medication. °Elevated temperature. °Dressing soaked with blood. °Inability to move fingers. °White or bluish color to fingers. ° ° ° °Post Anesthesia Home Care Instructions ° °Activity: °Get plenty of rest for the remainder of the day. A responsible adult should stay with you for 24 hours following the procedure.  °For the next 24 hours, DO NOT: °-Drive a car °-Operate machinery °-Drink alcoholic beverages °-Take any medication unless instructed by your physician °-Make any legal decisions or sign important papers. ° °Meals: °Start with liquid foods such as gelatin or soup. Progress to regular foods as tolerated. Avoid greasy, spicy, heavy foods. If nausea and/or vomiting occur, drink only clear liquids until the nausea and/or vomiting subsides. Call your physician if vomiting continues. ° °Special Instructions/Symptoms: °Your throat may feel dry or sore from the anesthesia or the breathing tube placed in your throat during surgery. If this causes discomfort, gargle with warm salt water. The discomfort should disappear within  24 hours. ° °If you had a scopolamine patch placed behind your ear for the management of post- operative nausea and/or vomiting: ° °1. The medication in the patch is effective for 72 hours, after which it should be removed.  Wrap patch in a tissue and discard in the trash. Wash hands thoroughly with soap and water. °2. You may remove the patch earlier than 72 hours if you experience unpleasant side effects which may include dry mouth, dizziness or visual disturbances. °3. Avoid touching the patch. Wash your hands with soap and water after contact with the patch. °  °Call your surgeon if you experience:  ° °1.  Fever over 101.0. °2.  Inability to urinate. °3.  Nausea and/or vomiting. °4.  Extreme swelling or bruising at the surgical site. °5.  Continued bleeding from the incision. °6.  Increased pain, redness or drainage from the incision. °7.  Problems related to your pain medication. °8.  Any problems and/or concerns °

## 2016-03-22 NOTE — Brief Op Note (Signed)
03/22/2016  9:33 AM  PATIENT:  Kevin Bartlett  66 y.o. male  PRE-OPERATIVE DIAGNOSIS:  LEFT CARPAL TUNNEL SYNDROME DUPUYTREN'S CONTRACTURE LEFT RING FINGER  POST-OPERATIVE DIAGNOSIS:  LEFT CARPAL TUNNEL SYNDROME DUPUYTREN'S  PROCEDURE:  Procedure(s) with comments: LEFT CARPAL TUNNEL RELEASE FASCIECTOMY LEFT RING FINGER (Left) - AXILLARY BLOCK FASCIECTOMY LEFT RING FINGER (Left)  SURGEON:  Surgeon(s) and Role:    * Cindee SaltGary Warda Mcqueary, MD - Primary  PHYSICIAN ASSISTANT:   ASSISTANTS: none   ANESTHESIA:   local and general  EBL:  Total I/O In: 1500 [I.V.:1500] Out: -   BLOOD ADMINISTERED:none  DRAINS: none   LOCAL MEDICATIONS USED:  BUPIVICAINE   SPECIMEN:  Excision  DISPOSITION OF SPECIMEN:  PATHOLOGY  COUNTS:  YES  TOURNIQUET:   Total Tourniquet Time Documented: Upper Arm (Left) - 43 minutes Total: Upper Arm (Left) - 43 minutes   DICTATION: .Other Dictation: Dictation Number [id number]  PLAN OF CARE: Discharge to home after PACU  PATIENT DISPOSITION:  PACU - hemodynamically stable.

## 2016-03-22 NOTE — H&P (Signed)
Kevin MaximRichard F Bartlett is an 66 y.o. male.   Chief Complaint: numbness and contractur e left hand HPI: Kevin Bartlett is a 66 year old right hand dominant male referred by Dr. Manus GunningEhinger for consultation with respect to pain, numbness and tingling in both hands, left equal to right, this is burning in nature, index, middle, and ring fingers it awakens him 7/7 nights. He has no history of injury to the hand or neck. He states that occasionally it will radiate up his right arm. He has a VAS score of 8/10. It has been going on for five to six months increasing over the past month. He has no history of injury. He has had positive nerve conductions done by Dr. Neale BurlyFreeman revealing a motor delay of 5.5 bilaterally with sensory delays on each side of approximately 5. He has had each of the carpal canal injected with some relief. He continues to complain of a moderate pain on his right side lesser on his left side. He has a cord on his left side with some contracture to the metacarpal phalangeal joint ring finger. 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 He is awake and occasionally at night.  He has no history of diabetes, thyroid problems. He does have a history of arthritis. No history of gout. Family history is positive for diabetes and arthritis. Negative for thyroid problems and gout.      Past Medical History:  Diagnosis Date  . Arthritis    back, knees  . Asthma   . Depression   . Erectile dysfunction `  . Guaiac positive stools   . HTN (hypertension)   . Obesity (BMI 30-39.9)   . OSA (obstructive sleep apnea)    uses CPAP most of time  . Spinal stenosis     Past Surgical History:  Procedure Laterality Date  . APPENDECTOMY    . INGUINAL HERNIA REPAIR Bilateral     Family History  Problem Relation Age of Onset  . Heart disease Father    Social History:  reports that he quit smoking about 13 years ago. His smoking use included Cigars. He has never used smokeless tobacco.  He reports that he drinks alcohol. He reports that he does not use drugs.  Allergies: No Known Allergies  No prescriptions prior to admission.    No results found for this or any previous visit (from the past 48 hour(s)).  No results found.   Pertinent items are noted in HPI.  Height 6' (1.829 m), weight 88 kg (194 lb).  General appearance: alert, cooperative and appears stated age Head: Normocephalic, without obvious abnormality Neck: no JVD Resp: clear to auscultation bilaterally Cardio: regular rate and rhythm, S1, S2 normal, no murmur, click, rub or gallop GI: soft, non-tender; bowel sounds normal; no masses,  no organomegaly Extremities: numb left hand and contracture left ring finger Pulses: 2+ and symmetric Skin: Skin color, texture, turgor normal. No rashes or lesions Neurologic: Grossly normal Incision/Wound: na  Assessment/Plan Assessment:  1. Bilateral carpal tunnel syndrome  2. Contracture of palmar fascia    Plan: We have discussed possibility of surgical treatment of his carpal tunnels and that these continue to bother him. We will also recommend a fasciectomy of his cord to the ring finger left side should he elect to have carpal tunnel release done on that side. Would not recommend recommend doing anything to the Dupuytren's cord on his right side should he elect to have carpal tunnel release on the right  side. He would like to proceed to the left side being done. Pre-peri-and postoperative course are discussed along with risk complications. He is where there is no guarantee to the surgery the possibility of infection recurrence injury to arteries nerves tendons incomplete release symptoms and dystrophy. Recommend left carpal tunnel release along with fasciectomy to the ring finger. Shins are encouraged and answered to his satisfaction. This scheduled as an outpatient under regional anesthesia      Stacyann Mcconaughy R 03/22/2016, 4:38 AM

## 2016-03-22 NOTE — Op Note (Signed)
Dictation Number [id number]

## 2016-03-23 ENCOUNTER — Encounter (HOSPITAL_BASED_OUTPATIENT_CLINIC_OR_DEPARTMENT_OTHER): Payer: Self-pay | Admitting: Orthopedic Surgery

## 2016-05-04 ENCOUNTER — Other Ambulatory Visit: Payer: Self-pay | Admitting: Orthopedic Surgery

## 2016-05-10 ENCOUNTER — Encounter (HOSPITAL_BASED_OUTPATIENT_CLINIC_OR_DEPARTMENT_OTHER): Payer: Self-pay | Admitting: *Deleted

## 2016-05-15 ENCOUNTER — Ambulatory Visit (HOSPITAL_BASED_OUTPATIENT_CLINIC_OR_DEPARTMENT_OTHER): Payer: Medicare Other | Admitting: Certified Registered"

## 2016-05-15 ENCOUNTER — Encounter (HOSPITAL_BASED_OUTPATIENT_CLINIC_OR_DEPARTMENT_OTHER): Payer: Self-pay | Admitting: Certified Registered"

## 2016-05-15 ENCOUNTER — Ambulatory Visit (HOSPITAL_BASED_OUTPATIENT_CLINIC_OR_DEPARTMENT_OTHER)
Admission: RE | Admit: 2016-05-15 | Discharge: 2016-05-15 | Disposition: A | Discharge status: Home / Self Care | Payer: Medicare Other | Source: Ambulatory Visit | Attending: Orthopedic Surgery | Admitting: Orthopedic Surgery

## 2016-05-15 ENCOUNTER — Encounter (HOSPITAL_BASED_OUTPATIENT_CLINIC_OR_DEPARTMENT_OTHER)
Admission: RE | Disposition: A | Discharge status: Home / Self Care | Payer: Self-pay | Source: Ambulatory Visit | Attending: Orthopedic Surgery

## 2016-05-15 DIAGNOSIS — M1990 Unspecified osteoarthritis, unspecified site: Secondary | ICD-10-CM | POA: Insufficient documentation

## 2016-05-15 DIAGNOSIS — Z87891 Personal history of nicotine dependence: Secondary | ICD-10-CM | POA: Insufficient documentation

## 2016-05-15 DIAGNOSIS — G4733 Obstructive sleep apnea (adult) (pediatric): Secondary | ICD-10-CM | POA: Insufficient documentation

## 2016-05-15 DIAGNOSIS — M720 Palmar fascial fibromatosis [Dupuytren]: Secondary | ICD-10-CM | POA: Diagnosis not present

## 2016-05-15 DIAGNOSIS — F329 Major depressive disorder, single episode, unspecified: Secondary | ICD-10-CM | POA: Insufficient documentation

## 2016-05-15 DIAGNOSIS — J45909 Unspecified asthma, uncomplicated: Secondary | ICD-10-CM | POA: Diagnosis not present

## 2016-05-15 DIAGNOSIS — Z79899 Other long term (current) drug therapy: Secondary | ICD-10-CM | POA: Insufficient documentation

## 2016-05-15 DIAGNOSIS — G5601 Carpal tunnel syndrome, right upper limb: Secondary | ICD-10-CM | POA: Diagnosis not present

## 2016-05-15 DIAGNOSIS — Z791 Long term (current) use of non-steroidal anti-inflammatories (NSAID): Secondary | ICD-10-CM | POA: Diagnosis not present

## 2016-05-15 DIAGNOSIS — I10 Essential (primary) hypertension: Secondary | ICD-10-CM | POA: Diagnosis not present

## 2016-05-15 HISTORY — PX: CARPAL TUNNEL RELEASE: SHX101

## 2016-05-15 SURGERY — CARPAL TUNNEL RELEASE
Anesthesia: General | Laterality: Right

## 2016-05-15 MED ORDER — SCOPOLAMINE 1 MG/3DAYS TD PT72: 1.0000 | MEDICATED_PATCH | Freq: Once | TRANSDERMAL | Status: DC | PRN

## 2016-05-15 MED ORDER — FENTANYL CITRATE (PF) 100 MCG/2ML IJ SOLN: 25.0000 ug | INTRAMUSCULAR | Status: DC | PRN

## 2016-05-15 MED ORDER — LACTATED RINGERS IV SOLN
INTRAVENOUS | Status: DC
  Administered 2016-05-15 (×2): via INTRAVENOUS

## 2016-05-15 MED ORDER — CEFAZOLIN SODIUM-DEXTROSE 2-4 GM/100ML-% IV SOLN
2.0000 g | INTRAVENOUS | Status: AC
  Administered 2016-05-15: 2 g via INTRAVENOUS

## 2016-05-15 MED ORDER — BUPIVACAINE HCL (PF) 0.25 % IJ SOLN
INTRAMUSCULAR | Status: DC | PRN
  Administered 2016-05-15: 8 mL

## 2016-05-15 MED ORDER — MEPERIDINE HCL 25 MG/ML IJ SOLN: 6.2500 mg | INTRAMUSCULAR | Status: DC | PRN

## 2016-05-15 MED ORDER — LIDOCAINE HCL (CARDIAC) 20 MG/ML IV SOLN
INTRAVENOUS | Status: DC | PRN
  Administered 2016-05-15: 60 mg via INTRAVENOUS

## 2016-05-15 MED ORDER — OXYCODONE HCL 5 MG/5ML PO SOLN: 5.0000 mg | Freq: Once | ORAL | Status: DC | PRN

## 2016-05-15 MED ORDER — DEXAMETHASONE SODIUM PHOSPHATE 10 MG/ML IJ SOLN
INTRAMUSCULAR | Status: DC | PRN
  Administered 2016-05-15: 10 mg via INTRAVENOUS

## 2016-05-15 MED ORDER — FENTANYL CITRATE (PF) 100 MCG/2ML IJ SOLN
50.0000 ug | INTRAMUSCULAR | Status: AC | PRN
  Administered 2016-05-15: 50 ug via INTRAVENOUS
  Administered 2016-05-15: 25 ug via INTRAVENOUS
  Administered 2016-05-15: 50 ug via INTRAVENOUS

## 2016-05-15 MED ORDER — MIDAZOLAM HCL 2 MG/2ML IJ SOLN
INTRAMUSCULAR | Status: AC
  Filled 2016-05-15: qty 2

## 2016-05-15 MED ORDER — FENTANYL CITRATE (PF) 100 MCG/2ML IJ SOLN
INTRAMUSCULAR | Status: AC
Start: 2016-05-15 — End: 2016-05-15
  Filled 2016-05-15: qty 2

## 2016-05-15 MED ORDER — PROMETHAZINE HCL 25 MG/ML IJ SOLN: 6.2500 mg | INTRAMUSCULAR | Status: DC | PRN

## 2016-05-15 MED ORDER — PROPOFOL 10 MG/ML IV BOLUS
INTRAVENOUS | Status: DC | PRN
  Administered 2016-05-15: 200 mg via INTRAVENOUS

## 2016-05-15 MED ORDER — CHLORHEXIDINE GLUCONATE 4 % EX LIQD: 60.0000 mL | Freq: Once | CUTANEOUS | Status: DC

## 2016-05-15 MED ORDER — CEFAZOLIN SODIUM-DEXTROSE 2-4 GM/100ML-% IV SOLN
INTRAVENOUS | Status: AC
  Filled 2016-05-15: qty 100

## 2016-05-15 MED ORDER — MIDAZOLAM HCL 2 MG/2ML IJ SOLN
1.0000 mg | INTRAMUSCULAR | Status: DC | PRN
  Administered 2016-05-15: 2 mg via INTRAVENOUS

## 2016-05-15 MED ORDER — OXYCODONE HCL 5 MG PO TABS: 5.0000 mg | ORAL_TABLET | Freq: Once | ORAL | Status: DC | PRN

## 2016-05-15 MED ORDER — LACTATED RINGERS IV SOLN: INTRAVENOUS | Status: DC

## 2016-05-15 MED ORDER — LIDOCAINE 2% (20 MG/ML) 5 ML SYRINGE
INTRAMUSCULAR | Status: AC
  Filled 2016-05-15: qty 5

## 2016-05-15 MED ORDER — ONDANSETRON HCL 4 MG/2ML IJ SOLN
INTRAMUSCULAR | Status: AC
  Filled 2016-05-15: qty 2

## 2016-05-15 MED ORDER — HYDROCODONE-ACETAMINOPHEN 5-325 MG PO TABS
1.0000 | ORAL_TABLET | Freq: Four times a day (QID) | ORAL | 0 refills | Status: AC | PRN
Start: 2016-05-15 — End: ?

## 2016-05-15 MED ORDER — FENTANYL CITRATE (PF) 100 MCG/2ML IJ SOLN
INTRAMUSCULAR | Status: AC
  Filled 2016-05-15: qty 2

## 2016-05-15 SURGICAL SUPPLY — 31 items
BLADE SURG 15 STRL LF DISP TIS (BLADE) ×1 IMPLANT
BLADE SURG 15 STRL SS (BLADE) ×2
BNDG COHESIVE 3X5 TAN STRL LF (GAUZE/BANDAGES/DRESSINGS) ×3 IMPLANT
BNDG ESMARK 4X9 LF (GAUZE/BANDAGES/DRESSINGS) ×3 IMPLANT
BNDG GAUZE ELAST 4 BULKY (GAUZE/BANDAGES/DRESSINGS) ×3 IMPLANT
CHLORAPREP W/TINT 26ML (MISCELLANEOUS) ×3 IMPLANT
CORDS BIPOLAR (ELECTRODE) ×3 IMPLANT
COVER BACK TABLE 60X90IN (DRAPES) ×3 IMPLANT
COVER MAYO STAND STRL (DRAPES) ×3 IMPLANT
CUFF TOURNIQUET SINGLE 18IN (TOURNIQUET CUFF) ×3 IMPLANT
DRAPE EXTREMITY T 121X128X90 (DRAPE) ×3 IMPLANT
DRAPE SURG 17X23 STRL (DRAPES) ×3 IMPLANT
DRSG PAD ABDOMINAL 8X10 ST (GAUZE/BANDAGES/DRESSINGS) ×3 IMPLANT
GAUZE SPONGE 4X4 12PLY STRL (GAUZE/BANDAGES/DRESSINGS) ×3 IMPLANT
GAUZE XEROFORM 1X8 LF (GAUZE/BANDAGES/DRESSINGS) ×3 IMPLANT
GLOVE BIOGEL PI IND STRL 8.5 (GLOVE) ×1 IMPLANT
GLOVE BIOGEL PI INDICATOR 8.5 (GLOVE) ×2
GLOVE SURG ORTHO 8.0 STRL STRW (GLOVE) ×3 IMPLANT
GOWN STRL REUS W/ TWL LRG LVL3 (GOWN DISPOSABLE) ×1 IMPLANT
GOWN STRL REUS W/TWL LRG LVL3 (GOWN DISPOSABLE) ×2
GOWN STRL REUS W/TWL XL LVL3 (GOWN DISPOSABLE) ×3 IMPLANT
NEEDLE PRECISIONGLIDE 27X1.5 (NEEDLE) IMPLANT
NS IRRIG 1000ML POUR BTL (IV SOLUTION) ×3 IMPLANT
PACK BASIN DAY SURGERY FS (CUSTOM PROCEDURE TRAY) ×3 IMPLANT
STOCKINETTE 4X48 STRL (DRAPES) ×3 IMPLANT
SUT ETHILON 4 0 PS 2 18 (SUTURE) ×3 IMPLANT
SUT VICRYL 4-0 PS2 18IN ABS (SUTURE) IMPLANT
SYR BULB 3OZ (MISCELLANEOUS) ×3 IMPLANT
SYR CONTROL 10ML LL (SYRINGE) IMPLANT
TOWEL OR 17X24 6PK STRL BLUE (TOWEL DISPOSABLE) ×3 IMPLANT
UNDERPAD 30X30 (UNDERPADS AND DIAPERS) ×3 IMPLANT

## 2016-05-15 NOTE — H&P (Signed)
Kevin Bartlett is an 66 y.o. male.   Chief Complaint: numbness right hand HPI: [name] is a 66 year old right hand dominant male referred by Dr. Manus Gunning for consultation with respect to pain, numbness and tingling in both hands, left equal to right, this is burning in nature, index, middle, and ring fingers it awakens him 7/7 nights. He has no history of injury to the hand or neck. He states that occasionally it will radiate up his right arm. He has a VAS score of 8/10. It has been going on for five to six months increasing over the past month. He has no history of injury. He has had nerve conductions done by Dr. Neale Burly. He has no history of diabetes, thyroid problems. He does have a history of arthritis. No history of gout. Family history is positive for diabetes and arthritis. Negative for thyroid problems and gout.He has had positive nerve conductions done by Dr. Neale Burly revealing a motor delay of 5.5 bilaterally with sensory delays on each side of approximately 5. He has had each of the carpal canal injected with some relief. He continues to complain of a moderate pain on his right side lesser on his left side.          Past Medical History:  Diagnosis Date  . Arthritis    back, knees  . Asthma   . Depression   . Erectile dysfunction `  . HTN (hypertension)   . OSA (obstructive sleep apnea)    uses CPAP most of time  . Spinal stenosis     Past Surgical History:  Procedure Laterality Date  . APPENDECTOMY    . CARPAL TUNNEL RELEASE Left 03/22/2016   Procedure: LEFT CARPAL TUNNEL RELEASE FASCIECTOMY LEFT RING FINGER;  Surgeon: Cindee Salt, MD;  Location: [facility];  Service: Orthopedics;  Laterality: Left;  AXILLARY BLOCK  . FASCIECTOMY Left 03/22/2016   Procedure: FASCIECTOMY LEFT RING FINGER;  Surgeon: Cindee Salt, MD;  Location:  SURGERY CENTER;  Service: Orthopedics;  Laterality: Left;  . INGUINAL HERNIA REPAIR Bilateral     Family History  Problem  Relation Age of Onset  . Heart disease Father    Social History:  reports that he quit smoking about 13 years ago. His smoking use included Cigars. He has never used smokeless tobacco. He reports that he drinks alcohol. He reports that he does not use drugs.  Allergies: No Known Allergies  No prescriptions prior to admission.    No results found for this or any previous visit (from the past 48 hour(s)).  No results found.   Pertinent items are noted in HPI.  Height 6' (1.829 m), weight 88.5 kg (195 lb).  General appearance: alert, cooperative and appears stated age Head: Normocephalic, without obvious abnormality Neck: no JVD and supple, symmetrical, trachea midline Resp: clear to auscultation bilaterally Cardio: regular rate and rhythm, S1, S2 normal, no murmur, click, rub or gallop GI: soft, non-tender; bowel sounds normal; no masses,  no organomegaly Extremities: numbness right hand Pulses: 2+ and symmetric Skin: Skin color, texture, turgor normal. No rashes or lesions Neurologic: Grossly normal Incision/Wound: na  Assessment/Plan  Assessment:  1. Bilateral carpal tunnel syndrome  2. Contracture of palmar fascia    Plan: He would like to proceed to have his right carpal tunnel release. He is aware of risk complications including infection recurrence injury to arteries nerves tendons complete relief of symptoms dystrophy. Would not recommend doing anything to the Dupuytren's nodule at this time.  He is scheduled for right carpal tunnel release and outpatient under regional anesthesia.         Axyl Sitzman R 05/15/2016, 9:16 AM

## 2016-05-15 NOTE — Transfer of Care (Signed)
Immediate Anesthesia Transfer of Care Note  Patient: Wilber Bihariichard F Bou  Procedure(s) Performed: Procedure(s): RIGHT CARPAL TUNNEL RELEASE (Right)  Patient Location: PACU  Anesthesia Type:General  Level of Consciousness: awake and patient cooperative  Airway & Oxygen Therapy: Patient Spontanous Breathing and Patient connected to face mask oxygen  Post-op Assessment: Report given to RN and Post -op Vital signs reviewed and stable  Post vital signs: Reviewed and stable  Last Vitals:  Vitals:   05/15/16 1011  BP: (!) 141/74  Pulse: (!) 57  Resp: 16  Temp: 36.7 C    Last Pain:  Vitals:   05/15/16 1011  TempSrc: Oral  PainSc: 3       Patients Stated Pain Goal: 3 (05/15/16 1011)  Complications: No apparent anesthesia complications

## 2016-05-15 NOTE — Op Note (Signed)
NAME:  Kevin Bartlett, Kevin Bartlett NO.:  [id number]  MEDICAL RECORD NO.:  [id number]  LOCATION:                                 FACILITY:  PHYSICIAN:  Cindee Salt, M.D.            [name]  DATE OF PROCEDURE:  05/15/2016 DATE OF DISCHARGE:                              OPERATIVE REPORT   PREOPERATIVE DIAGNOSIS:  Carpal tunnel syndrome, right hand.  POSTOPERATIVE DIAGNOSIS:  Carpal tunnel syndrome, right hand.  OPERATION:  Decompression, right median nerve.  SURGEON:  Cindee Salt, MD.  ANESTHESIA:  General.  PLACE OF SURGERY:  [facility].  ANESTHESIOLOGIST:  [place].  HISTORY:  The patient is a 66 year old male with a history of bilateral carpal tunnel syndrome.  EMG nerve conduction is positive.  He has had his left side operated on in the past.  He is admitted now for decompression median nerve, right hand.  Pre, peri, and postoperative course have been discussed along with risks and complications.  He is aware that there is no guarantee to the surgery; the possibility of infection; recurrence of injury to arteries, nerves, tendons; incomplete relief of symptoms and dystrophy.  In the preoperative area, the patient was seen, the extremity marked by both patient and surgeon.  Antibiotic given.  PROCEDURE IN DETAIL:  The patient was brought to the operating room, where a general anesthetic was carried out without difficulty.  He was prepped using ChloraPrep in a supine position with the right arm free. A 3-minute dry time was allowed and time-out taken, confirming the patient and procedure.  The limb was exsanguinated with an Esmarch bandage.  Tourniquet placed on the forearm was inflated to 250 mmHg.  A longitudinal incision was made in the right palm.  This was carried down through subcutaneous tissue.  Bleeders were electrocauterized with bipolar.  The dissection was then carried down identifying the superficial palmar arch and flexor tendon to  the ring finger. Retractors were placed protecting neurovascular bundles of the median nerve and neurovascular bundle of the ulnar nerve radially and ulnarly. The dissection was then carried along the ulnar border of the flexor retinaculum releasing the flexor retinaculum to the level just proximal to the hamate hook.  A right angle and Sewall retractor were placed between skin and forearm fascia.  The underlying tendons and nerve were dissected free from the distal forearm fascia with blunt scissors.  The flexor retinaculum and distal forearm fascia were then released to approximately 1-1/2 to 2 cm proximal to the wrist crease under direct vision.  The canal was explored.  The inner compression to the nerve was apparent.  Motor branch entered into muscle distally.  The wound was copiously irrigated with saline.  The skin was then closed with interrupted 4-0 nylon sutures.  A local infiltration with 0.25% bupivacaine without epinephrine was given.  Approximately 8 mL was used. A sterile compressive dressing was applied.  On deflation of the tourniquet, all fingers immediately pinked.  He was taken to the recovery room for observation in satisfactory condition.  He will be discharged to home to return to [facility] [place] in 1 week,  on [place].          ______________________________ Cindee SaltGary Makensey Rego, M.D.     GK/MEDQ  D:  05/15/2016  T:  05/15/2016  Job:  [id number]

## 2016-05-15 NOTE — Anesthesia Preprocedure Evaluation (Addendum)
Anesthesia Evaluation  Patient identified by MRN, date of birth, ID band Patient awake    Reviewed: Allergy & Precautions, NPO status , Patient's Chart, lab work & pertinent test results  Airway Mallampati: II  TM Distance: >3 FB Neck ROM: Full    Dental  (+) Poor Dentition, Dental Advisory Given   Pulmonary asthma , sleep apnea and Continuous Positive Airway Pressure Ventilation , former smoker,    breath sounds clear to auscultation       Cardiovascular hypertension, Pt. on medications  Rhythm:Regular Rate:Normal     Neuro/Psych PSYCHIATRIC DISORDERS Depression negative neurological ROS     GI/Hepatic negative GI ROS, Neg liver ROS,   Endo/Other  negative endocrine ROS  Renal/GU negative Renal ROS  negative genitourinary   Musculoskeletal  (+) Arthritis , Osteoarthritis,    Abdominal   Peds negative pediatric ROS (+)  Hematology negative hematology ROS (+)   Anesthesia Other Findings   Reproductive/Obstetrics negative OB ROS                            No results found for: WBC, HGB, HCT, MCV, PLT  EKG: normal sinus rhythm, 1st degree AV block.  Anesthesia Physical Anesthesia Plan  ASA: II  Anesthesia Plan: General   Post-op Pain Management:    Induction: Intravenous  Airway Management Planned: LMA  Additional Equipment:   Intra-op Plan:   Post-operative Plan: Extubation in OR  Informed Consent: I have reviewed the patients History and Physical, chart, labs and discussed the procedure including the risks, benefits and alternatives for the proposed anesthesia with the patient or authorized representative who has indicated his/her understanding and acceptance.   Dental advisory given  Plan Discussed with: CRNA  Anesthesia Plan Comments:        Anesthesia Quick Evaluation

## 2016-05-15 NOTE — Discharge Instructions (Addendum)

## 2016-05-15 NOTE — Op Note (Signed)
Dictation Number [id number]

## 2016-05-15 NOTE — Brief Op Note (Signed)
05/15/2016  11:00 AM  PATIENT:  Kevin Bartlett  66 y.o. male  PRE-OPERATIVE DIAGNOSIS:  carpal tunnel syndrome right  POST-OPERATIVE DIAGNOSIS:  carpal tunnel syndrome right  PROCEDURE:  Procedure(s): RIGHT CARPAL TUNNEL RELEASE (Right)  SURGEON:  Surgeon(s) and Role:    * Cindee SaltGary Heiley Shaikh, MD - Primary  PHYSICIAN ASSISTANT:   ASSISTANTS: none   ANESTHESIA:   local and regional  EBL:  No intake/output data recorded.  BLOOD ADMINISTERED:none  DRAINS: none   LOCAL MEDICATIONS USED:  BUPIVICAINE   SPECIMEN:  No Specimen  DISPOSITION OF SPECIMEN:  N/A  COUNTS:  YES  TOURNIQUET:   Total Tourniquet Time Documented: Forearm (Right) - 11 minutes Total: Forearm (Right) - 11 minutes   DICTATION: .Other Dictation: Dictation Number [id number]  PLAN OF CARE: Discharge to home after PACU  PATIENT DISPOSITION:  PACU - hemodynamically stable.

## 2016-05-15 NOTE — Anesthesia Postprocedure Evaluation (Addendum)
Anesthesia Post Note  Patient: Kevin BihariRichard F Bartlett  Procedure(s) Performed: Procedure(s) (LRB): RIGHT CARPAL TUNNEL RELEASE (Right)  Patient location during evaluation: PACU Anesthesia Type: General Level of consciousness: awake and alert Pain management: pain level controlled Vital Signs Assessment: post-procedure vital signs reviewed and stable Respiratory status: spontaneous breathing, nonlabored ventilation, respiratory function stable and patient connected to nasal cannula oxygen Cardiovascular status: blood pressure returned to baseline and stable Postop Assessment: no signs of nausea or vomiting Anesthetic complications: no       Last Vitals:  Vitals:   05/15/16 1132 05/15/16 1156  BP: 138/81 138/76  Pulse: (!) 57 (!) 58  Resp: 19 16  Temp:  36.4 C    Last Pain:  Vitals:   05/15/16 1156  TempSrc: Oral  PainSc: 0-No pain                 Shelton SilvasKevin D Charnell Peplinski

## 2016-05-15 NOTE — Anesthesia Procedure Notes (Signed)
Procedure Name: LMA Insertion Date/Time: 05/15/2016 10:31 AM Performed by: Hara Milholland D Pre-anesthesia Checklist: Patient identified, Emergency Drugs available, Suction available and Patient being monitored Patient Re-evaluated:Patient Re-evaluated prior to inductionOxygen Delivery Method: Circle system utilized Preoxygenation: Pre-oxygenation with 100% oxygen Intubation Type: IV induction Ventilation: Mask ventilation without difficulty LMA: LMA inserted LMA Size: 4.0 Number of attempts: 1 Airway Equipment and Method: Bite block Placement Confirmation: positive ETCO2 Tube secured with: Tape Dental Injury: Teeth and Oropharynx as per pre-operative assessment

## 2016-05-16 ENCOUNTER — Encounter (HOSPITAL_BASED_OUTPATIENT_CLINIC_OR_DEPARTMENT_OTHER): Payer: Self-pay | Admitting: Orthopedic Surgery

## 2016-08-01 DIAGNOSIS — E781 Pure hyperglyceridemia: Secondary | ICD-10-CM | POA: Diagnosis not present

## 2016-08-01 DIAGNOSIS — G4733 Obstructive sleep apnea (adult) (pediatric): Secondary | ICD-10-CM | POA: Diagnosis not present

## 2016-08-01 DIAGNOSIS — Z0000 Encounter for general adult medical examination without abnormal findings: Secondary | ICD-10-CM | POA: Diagnosis not present

## 2016-08-01 DIAGNOSIS — I10 Essential (primary) hypertension: Secondary | ICD-10-CM | POA: Diagnosis not present

## 2016-08-01 DIAGNOSIS — M5416 Radiculopathy, lumbar region: Secondary | ICD-10-CM | POA: Diagnosis not present

## 2016-08-01 DIAGNOSIS — Z125 Encounter for screening for malignant neoplasm of prostate: Secondary | ICD-10-CM | POA: Diagnosis not present

## 2016-08-01 DIAGNOSIS — Z23 Encounter for immunization: Secondary | ICD-10-CM | POA: Diagnosis not present

## 2016-08-01 DIAGNOSIS — K130 Diseases of lips: Secondary | ICD-10-CM | POA: Diagnosis not present

## 2016-08-01 DIAGNOSIS — Z720 Tobacco use: Secondary | ICD-10-CM | POA: Diagnosis not present

## 2016-08-02 ENCOUNTER — Other Ambulatory Visit: Payer: Self-pay | Admitting: Family Medicine

## 2016-08-02 DIAGNOSIS — M5416 Radiculopathy, lumbar region: Secondary | ICD-10-CM

## 2016-08-15 ENCOUNTER — Ambulatory Visit
Admission: RE | Admit: 2016-08-15 | Discharge: 2016-08-15 | Disposition: A | Discharge status: Home / Self Care | Payer: Medicare Other | Source: Ambulatory Visit | Attending: Family Medicine | Admitting: Family Medicine

## 2016-08-15 DIAGNOSIS — M48061 Spinal stenosis, lumbar region without neurogenic claudication: Secondary | ICD-10-CM | POA: Diagnosis not present

## 2016-08-15 DIAGNOSIS — M5416 Radiculopathy, lumbar region: Secondary | ICD-10-CM

## 2016-08-17 NOTE — Addendum Note (Signed)
Addendum  created 08/17/16 1044 by Denis Koppel D, MD   Sign clinical note    

## 2016-08-18 NOTE — Addendum Note (Signed)
Addendum  created 08/18/16 1004 by Yatzari Jonsson, MD   Sign clinical note    

## 2016-08-18 NOTE — Addendum Note (Signed)
Addendum  created 08/18/16 0754 by Heather RobertsSinger, Ilda Laskin, MD   Sign clinical note

## 2016-11-15 DIAGNOSIS — I10 Essential (primary) hypertension: Secondary | ICD-10-CM | POA: Diagnosis not present

## 2016-11-15 DIAGNOSIS — Z6826 Body mass index (BMI) 26.0-26.9, adult: Secondary | ICD-10-CM | POA: Diagnosis not present

## 2016-11-15 DIAGNOSIS — M48062 Spinal stenosis, lumbar region with neurogenic claudication: Secondary | ICD-10-CM | POA: Diagnosis not present

## 2016-11-15 DIAGNOSIS — M4316 Spondylolisthesis, lumbar region: Secondary | ICD-10-CM | POA: Diagnosis not present

## 2016-12-13 DIAGNOSIS — I10 Essential (primary) hypertension: Secondary | ICD-10-CM | POA: Diagnosis not present

## 2016-12-13 DIAGNOSIS — E781 Pure hyperglyceridemia: Secondary | ICD-10-CM | POA: Diagnosis not present

## 2017-02-01 DIAGNOSIS — Z720 Tobacco use: Secondary | ICD-10-CM | POA: Diagnosis not present

## 2017-02-01 DIAGNOSIS — M5416 Radiculopathy, lumbar region: Secondary | ICD-10-CM | POA: Diagnosis not present

## 2017-02-01 DIAGNOSIS — G4733 Obstructive sleep apnea (adult) (pediatric): Secondary | ICD-10-CM | POA: Diagnosis not present

## 2017-02-01 DIAGNOSIS — M48061 Spinal stenosis, lumbar region without neurogenic claudication: Secondary | ICD-10-CM | POA: Diagnosis not present

## 2017-02-01 DIAGNOSIS — I10 Essential (primary) hypertension: Secondary | ICD-10-CM | POA: Diagnosis not present

## 2017-02-01 DIAGNOSIS — G894 Chronic pain syndrome: Secondary | ICD-10-CM | POA: Diagnosis not present

## 2017-02-01 DIAGNOSIS — E781 Pure hyperglyceridemia: Secondary | ICD-10-CM | POA: Diagnosis not present

## 2017-05-06 DIAGNOSIS — M5416 Radiculopathy, lumbar region: Secondary | ICD-10-CM | POA: Diagnosis not present

## 2017-05-06 DIAGNOSIS — G894 Chronic pain syndrome: Secondary | ICD-10-CM | POA: Diagnosis not present

## 2017-05-06 DIAGNOSIS — M48061 Spinal stenosis, lumbar region without neurogenic claudication: Secondary | ICD-10-CM | POA: Diagnosis not present

## 2017-08-02 DIAGNOSIS — Z136 Encounter for screening for cardiovascular disorders: Secondary | ICD-10-CM | POA: Diagnosis not present

## 2017-08-02 DIAGNOSIS — M48061 Spinal stenosis, lumbar region without neurogenic claudication: Secondary | ICD-10-CM | POA: Diagnosis not present

## 2017-08-02 DIAGNOSIS — Z0000 Encounter for general adult medical examination without abnormal findings: Secondary | ICD-10-CM | POA: Diagnosis not present

## 2017-08-02 DIAGNOSIS — M5416 Radiculopathy, lumbar region: Secondary | ICD-10-CM | POA: Diagnosis not present

## 2017-08-02 DIAGNOSIS — Z1389 Encounter for screening for other disorder: Secondary | ICD-10-CM | POA: Diagnosis not present

## 2017-08-02 DIAGNOSIS — I10 Essential (primary) hypertension: Secondary | ICD-10-CM | POA: Diagnosis not present

## 2017-08-02 DIAGNOSIS — G894 Chronic pain syndrome: Secondary | ICD-10-CM | POA: Diagnosis not present

## 2017-08-02 DIAGNOSIS — Z720 Tobacco use: Secondary | ICD-10-CM | POA: Diagnosis not present

## 2017-08-02 DIAGNOSIS — G4733 Obstructive sleep apnea (adult) (pediatric): Secondary | ICD-10-CM | POA: Diagnosis not present

## 2017-08-02 DIAGNOSIS — E781 Pure hyperglyceridemia: Secondary | ICD-10-CM | POA: Diagnosis not present

## 2017-08-02 DIAGNOSIS — Z1159 Encounter for screening for other viral diseases: Secondary | ICD-10-CM | POA: Diagnosis not present

## 2017-11-01 DIAGNOSIS — L03019 Cellulitis of unspecified finger: Secondary | ICD-10-CM | POA: Diagnosis not present

## 2017-11-01 DIAGNOSIS — I10 Essential (primary) hypertension: Secondary | ICD-10-CM | POA: Diagnosis not present

## 2017-11-13 DIAGNOSIS — M48061 Spinal stenosis, lumbar region without neurogenic claudication: Secondary | ICD-10-CM | POA: Diagnosis not present

## 2017-11-13 DIAGNOSIS — G894 Chronic pain syndrome: Secondary | ICD-10-CM | POA: Diagnosis not present

## 2017-11-13 DIAGNOSIS — M5416 Radiculopathy, lumbar region: Secondary | ICD-10-CM | POA: Diagnosis not present

## 2018-01-15 DIAGNOSIS — H2513 Age-related nuclear cataract, bilateral: Secondary | ICD-10-CM | POA: Diagnosis not present

## 2018-02-19 DIAGNOSIS — Z720 Tobacco use: Secondary | ICD-10-CM | POA: Diagnosis not present

## 2018-02-19 DIAGNOSIS — G4733 Obstructive sleep apnea (adult) (pediatric): Secondary | ICD-10-CM | POA: Diagnosis not present

## 2018-02-19 DIAGNOSIS — M48061 Spinal stenosis, lumbar region without neurogenic claudication: Secondary | ICD-10-CM | POA: Diagnosis not present

## 2018-02-19 DIAGNOSIS — E781 Pure hyperglyceridemia: Secondary | ICD-10-CM | POA: Diagnosis not present

## 2018-02-19 DIAGNOSIS — G894 Chronic pain syndrome: Secondary | ICD-10-CM | POA: Diagnosis not present

## 2018-02-19 DIAGNOSIS — I10 Essential (primary) hypertension: Secondary | ICD-10-CM | POA: Diagnosis not present

## 2018-02-19 DIAGNOSIS — M5416 Radiculopathy, lumbar region: Secondary | ICD-10-CM | POA: Diagnosis not present

## 2018-05-22 DIAGNOSIS — M48061 Spinal stenosis, lumbar region without neurogenic claudication: Secondary | ICD-10-CM | POA: Diagnosis not present

## 2018-05-22 DIAGNOSIS — M5416 Radiculopathy, lumbar region: Secondary | ICD-10-CM | POA: Diagnosis not present

## 2018-05-22 DIAGNOSIS — G894 Chronic pain syndrome: Secondary | ICD-10-CM | POA: Diagnosis not present

## 2018-10-29 DIAGNOSIS — G4733 Obstructive sleep apnea (adult) (pediatric): Secondary | ICD-10-CM | POA: Diagnosis not present

## 2018-10-29 DIAGNOSIS — Z720 Tobacco use: Secondary | ICD-10-CM | POA: Diagnosis not present

## 2018-10-29 DIAGNOSIS — M5416 Radiculopathy, lumbar region: Secondary | ICD-10-CM | POA: Diagnosis not present

## 2018-10-29 DIAGNOSIS — E781 Pure hyperglyceridemia: Secondary | ICD-10-CM | POA: Diagnosis not present

## 2018-10-29 DIAGNOSIS — G894 Chronic pain syndrome: Secondary | ICD-10-CM | POA: Diagnosis not present

## 2018-10-29 DIAGNOSIS — I10 Essential (primary) hypertension: Secondary | ICD-10-CM | POA: Diagnosis not present

## 2018-10-29 DIAGNOSIS — Z0000 Encounter for general adult medical examination without abnormal findings: Secondary | ICD-10-CM | POA: Diagnosis not present

## 2018-10-29 DIAGNOSIS — Z1389 Encounter for screening for other disorder: Secondary | ICD-10-CM | POA: Diagnosis not present

## 2018-10-29 DIAGNOSIS — M48061 Spinal stenosis, lumbar region without neurogenic claudication: Secondary | ICD-10-CM | POA: Diagnosis not present

## 2019-05-04 DIAGNOSIS — M48061 Spinal stenosis, lumbar region without neurogenic claudication: Secondary | ICD-10-CM | POA: Diagnosis not present

## 2019-05-04 DIAGNOSIS — G4733 Obstructive sleep apnea (adult) (pediatric): Secondary | ICD-10-CM | POA: Diagnosis not present

## 2019-05-04 DIAGNOSIS — M5416 Radiculopathy, lumbar region: Secondary | ICD-10-CM | POA: Diagnosis not present

## 2019-05-04 DIAGNOSIS — G894 Chronic pain syndrome: Secondary | ICD-10-CM | POA: Diagnosis not present

## 2019-05-04 DIAGNOSIS — Z720 Tobacco use: Secondary | ICD-10-CM | POA: Diagnosis not present

## 2019-05-04 DIAGNOSIS — E781 Pure hyperglyceridemia: Secondary | ICD-10-CM | POA: Diagnosis not present

## 2019-05-04 DIAGNOSIS — I10 Essential (primary) hypertension: Secondary | ICD-10-CM | POA: Diagnosis not present

## 2019-05-05 DIAGNOSIS — I10 Essential (primary) hypertension: Secondary | ICD-10-CM | POA: Diagnosis not present

## 2019-05-05 DIAGNOSIS — E781 Pure hyperglyceridemia: Secondary | ICD-10-CM | POA: Diagnosis not present

## 2019-05-17 ENCOUNTER — Ambulatory Visit: Payer: Medicare Other | Attending: Internal Medicine

## 2019-05-17 DIAGNOSIS — Z23 Encounter for immunization: Secondary | ICD-10-CM

## 2019-05-17 NOTE — Progress Notes (Signed)
   Covid-19 Vaccination Clinic  Name:  Kevin Bartlett    MRN: [id number] DOB: 1950-11-22  05/17/2019  Mr. Fuerstenberg was observed post Covid-19 immunization for 15 minutes without incidence. He was provided with Vaccine Information Sheet and instruction to access the V-Safe system.   Mr. Penning was instructed to call 911 with any severe reactions post vaccine: Marland Kitchen Difficulty breathing  . Swelling of your face and throat  . A fast heartbeat  . A bad rash all over your body  . Dizziness and weakness    Immunizations Administered    Name Date Dose VIS Date Route   Pfizer COVID-19 Vaccine 05/17/2019  4:16 PM 0.3 mL 02/27/2019 Intramuscular   Manufacturer: [employer], [employer]   Lot: [id number]   NDC: [address]-4

## 2019-06-16 ENCOUNTER — Ambulatory Visit: Payer: Medicare Other | Attending: Internal Medicine

## 2019-06-16 DIAGNOSIS — Z23 Encounter for immunization: Secondary | ICD-10-CM

## 2019-06-16 NOTE — Progress Notes (Signed)
   Covid-19 Vaccination Clinic  Name:  Kevin Bartlett    MRN: [id number] DOB: 1950-08-05  06/16/2019  Kevin Bartlett was observed post Covid-19 immunization for 15 minutes without incident. He was provided with Vaccine Information Sheet and instruction to access the V-Safe system.   Kevin Bartlett was instructed to call 911 with any severe reactions post vaccine: Marland Kitchen Difficulty breathing  . Swelling of face and throat  . A fast heartbeat  . A bad rash all over body  . Dizziness and weakness   Immunizations Administered    Name Date Dose VIS Date Route   Pfizer COVID-19 Vaccine 06/16/2019 10:32 AM 0.3 mL 02/27/2019 Intramuscular   Manufacturer: [employer], [employer]   Lot: [id number]   NDC: [address]-6

## 2019-11-17 DIAGNOSIS — Z720 Tobacco use: Secondary | ICD-10-CM | POA: Diagnosis not present

## 2019-11-17 DIAGNOSIS — G894 Chronic pain syndrome: Secondary | ICD-10-CM | POA: Diagnosis not present

## 2019-11-17 DIAGNOSIS — Z125 Encounter for screening for malignant neoplasm of prostate: Secondary | ICD-10-CM | POA: Diagnosis not present

## 2019-11-17 DIAGNOSIS — M5416 Radiculopathy, lumbar region: Secondary | ICD-10-CM | POA: Diagnosis not present

## 2019-11-17 DIAGNOSIS — M48061 Spinal stenosis, lumbar region without neurogenic claudication: Secondary | ICD-10-CM | POA: Diagnosis not present

## 2019-11-17 DIAGNOSIS — G4733 Obstructive sleep apnea (adult) (pediatric): Secondary | ICD-10-CM | POA: Diagnosis not present

## 2019-11-17 DIAGNOSIS — Z1389 Encounter for screening for other disorder: Secondary | ICD-10-CM | POA: Diagnosis not present

## 2019-11-17 DIAGNOSIS — I10 Essential (primary) hypertension: Secondary | ICD-10-CM | POA: Diagnosis not present

## 2019-11-17 DIAGNOSIS — Z0000 Encounter for general adult medical examination without abnormal findings: Secondary | ICD-10-CM | POA: Diagnosis not present

## 2019-11-17 DIAGNOSIS — E781 Pure hyperglyceridemia: Secondary | ICD-10-CM | POA: Diagnosis not present

## 2020-05-17 DIAGNOSIS — G4733 Obstructive sleep apnea (adult) (pediatric): Secondary | ICD-10-CM | POA: Diagnosis not present

## 2020-05-17 DIAGNOSIS — M5416 Radiculopathy, lumbar region: Secondary | ICD-10-CM | POA: Diagnosis not present

## 2020-05-17 DIAGNOSIS — Z720 Tobacco use: Secondary | ICD-10-CM | POA: Diagnosis not present

## 2020-05-17 DIAGNOSIS — I10 Essential (primary) hypertension: Secondary | ICD-10-CM | POA: Diagnosis not present

## 2020-05-17 DIAGNOSIS — M48061 Spinal stenosis, lumbar region without neurogenic claudication: Secondary | ICD-10-CM | POA: Diagnosis not present

## 2020-05-17 DIAGNOSIS — R635 Abnormal weight gain: Secondary | ICD-10-CM | POA: Diagnosis not present

## 2020-05-17 DIAGNOSIS — M79641 Pain in right hand: Secondary | ICD-10-CM | POA: Diagnosis not present

## 2020-05-17 DIAGNOSIS — G894 Chronic pain syndrome: Secondary | ICD-10-CM | POA: Diagnosis not present

## 2020-05-17 DIAGNOSIS — E781 Pure hyperglyceridemia: Secondary | ICD-10-CM | POA: Diagnosis not present

## 2020-05-25 DIAGNOSIS — M79642 Pain in left hand: Secondary | ICD-10-CM | POA: Diagnosis not present

## 2020-05-25 DIAGNOSIS — M19042 Primary osteoarthritis, left hand: Secondary | ICD-10-CM | POA: Diagnosis not present

## 2020-05-25 DIAGNOSIS — M19041 Primary osteoarthritis, right hand: Secondary | ICD-10-CM | POA: Diagnosis not present

## 2020-05-25 DIAGNOSIS — M65331 Trigger finger, right middle finger: Secondary | ICD-10-CM | POA: Diagnosis not present

## 2020-05-25 DIAGNOSIS — M65332 Trigger finger, left middle finger: Secondary | ICD-10-CM | POA: Diagnosis not present

## 2020-05-25 DIAGNOSIS — M79641 Pain in right hand: Secondary | ICD-10-CM | POA: Diagnosis not present

## 2020-06-29 DIAGNOSIS — M65331 Trigger finger, right middle finger: Secondary | ICD-10-CM | POA: Diagnosis not present

## 2020-06-29 DIAGNOSIS — M65332 Trigger finger, left middle finger: Secondary | ICD-10-CM | POA: Diagnosis not present

## 2020-07-13 DIAGNOSIS — M65331 Trigger finger, right middle finger: Secondary | ICD-10-CM | POA: Diagnosis not present

## 2020-07-13 DIAGNOSIS — M65332 Trigger finger, left middle finger: Secondary | ICD-10-CM | POA: Diagnosis not present

## 2020-08-24 DIAGNOSIS — M65331 Trigger finger, right middle finger: Secondary | ICD-10-CM | POA: Diagnosis not present

## 2020-08-24 DIAGNOSIS — M65332 Trigger finger, left middle finger: Secondary | ICD-10-CM | POA: Diagnosis not present

## 2020-10-26 DIAGNOSIS — M65332 Trigger finger, left middle finger: Secondary | ICD-10-CM | POA: Diagnosis not present

## 2020-10-26 DIAGNOSIS — M65331 Trigger finger, right middle finger: Secondary | ICD-10-CM | POA: Diagnosis not present

## 2020-11-17 DIAGNOSIS — Z0000 Encounter for general adult medical examination without abnormal findings: Secondary | ICD-10-CM | POA: Diagnosis not present

## 2020-11-17 DIAGNOSIS — M1990 Unspecified osteoarthritis, unspecified site: Secondary | ICD-10-CM | POA: Diagnosis not present

## 2020-11-17 DIAGNOSIS — G4733 Obstructive sleep apnea (adult) (pediatric): Secondary | ICD-10-CM | POA: Diagnosis not present

## 2020-11-17 DIAGNOSIS — M5416 Radiculopathy, lumbar region: Secondary | ICD-10-CM | POA: Diagnosis not present

## 2020-11-17 DIAGNOSIS — E781 Pure hyperglyceridemia: Secondary | ICD-10-CM | POA: Diagnosis not present

## 2020-11-17 DIAGNOSIS — G894 Chronic pain syndrome: Secondary | ICD-10-CM | POA: Diagnosis not present

## 2020-11-17 DIAGNOSIS — Z1389 Encounter for screening for other disorder: Secondary | ICD-10-CM | POA: Diagnosis not present

## 2020-11-17 DIAGNOSIS — Z720 Tobacco use: Secondary | ICD-10-CM | POA: Diagnosis not present

## 2020-11-17 DIAGNOSIS — I10 Essential (primary) hypertension: Secondary | ICD-10-CM | POA: Diagnosis not present

## 2020-12-28 DIAGNOSIS — M65331 Trigger finger, right middle finger: Secondary | ICD-10-CM | POA: Diagnosis not present

## 2020-12-28 DIAGNOSIS — M65332 Trigger finger, left middle finger: Secondary | ICD-10-CM | POA: Diagnosis not present

## 2021-03-24 DIAGNOSIS — M25511 Pain in right shoulder: Secondary | ICD-10-CM | POA: Diagnosis not present

## 2021-03-30 ENCOUNTER — Other Ambulatory Visit (HOSPITAL_BASED_OUTPATIENT_CLINIC_OR_DEPARTMENT_OTHER): Payer: Self-pay | Admitting: Orthopaedic Surgery

## 2021-03-30 DIAGNOSIS — M25511 Pain in right shoulder: Secondary | ICD-10-CM

## 2021-03-31 ENCOUNTER — Ambulatory Visit (HOSPITAL_BASED_OUTPATIENT_CLINIC_OR_DEPARTMENT_OTHER)
Admission: RE | Admit: 2021-03-31 | Discharge: 2021-03-31 | Disposition: A | Discharge status: Home / Self Care | Payer: Medicare HMO | Source: Ambulatory Visit | Attending: Orthopaedic Surgery | Admitting: Orthopaedic Surgery

## 2021-03-31 ENCOUNTER — Ambulatory Visit (INDEPENDENT_AMBULATORY_CARE_PROVIDER_SITE_OTHER): Payer: Medicare HMO | Admitting: Orthopaedic Surgery

## 2021-03-31 ENCOUNTER — Other Ambulatory Visit: Payer: Self-pay

## 2021-03-31 DIAGNOSIS — M25511 Pain in right shoulder: Secondary | ICD-10-CM | POA: Insufficient documentation

## 2021-03-31 DIAGNOSIS — S46011A Strain of muscle(s) and tendon(s) of the rotator cuff of right shoulder, initial encounter: Secondary | ICD-10-CM | POA: Diagnosis not present

## 2021-03-31 NOTE — Progress Notes (Signed)
Chief Complaint: Right shoulder pain     History of Present Illness:    Kevin Bartlett is a 71 y.o. male right-hand-dominant male presents with right shoulder pain approximately 1 week with inability to lift arm.  He states that he was needing to change lug knots on his tire and subsequently noticed limited range of motion about the right arm.  He states he did have a rotator cuff repair done 15 years prior which was doing well until a week prior.  He states that he is having throbbing pain with laying directly on the side.  He is no longer able to actively abduct the arm.  He does smoke a cigar daily.    Surgical History:   None  PMH/PSH/Family History/Social History/Meds/Allergies:    Past Medical History:  Diagnosis Date   Arthritis    back, knees   Asthma    Depression    Erectile dysfunction `   HTN (hypertension)    OSA (obstructive sleep apnea)    uses CPAP most of time   Spinal stenosis    Past Surgical History:  Procedure Laterality Date   APPENDECTOMY     CARPAL TUNNEL RELEASE Left 03/22/2016   Procedure: LEFT CARPAL TUNNEL RELEASE FASCIECTOMY LEFT RING FINGER;  Surgeon: Cindee Salt, MD;  Location: [facility];  Service: Orthopedics;  Laterality: Left;  AXILLARY BLOCK   CARPAL TUNNEL RELEASE Right 05/15/2016   Procedure: RIGHT CARPAL TUNNEL RELEASE;  Surgeon: Cindee Salt, MD;  Location: [facility];  Service: Orthopedics;  Laterality: Right;   FASCIECTOMY Left 03/22/2016   Procedure: FASCIECTOMY LEFT RING FINGER;  Surgeon: Cindee Salt, MD;  Location: [facility];  Service: Orthopedics;  Laterality: Left;   INGUINAL HERNIA REPAIR Bilateral    Social History   Socioeconomic History   Marital status: Single    Spouse name: Not on file   Number of children: Not on file   Years of education: Not on file   Highest education level: Not on file  Occupational History   Not on file  Tobacco Use    Smoking status: Former    Types: Cigars    Quit date: 01/16/2003    Years since quitting: 18.2   Smokeless tobacco: Never  Substance and Sexual Activity   Alcohol use: Yes    Comment: social   Drug use: No   Sexual activity: Not on file  Other Topics Concern   Not on file  Social History Narrative   Not on file   Social Determinants of Health   Financial Resource Strain: Not on file  Food Insecurity: Not on file  Transportation Needs: Not on file  Physical Activity: Not on file  Stress: Not on file  Social Connections: Not on file   Family History  Problem Relation Age of Onset   Heart disease Father    No Known Allergies Current Outpatient Medications  Medication Sig Dispense Refill   amitriptyline (ELAVIL) 75 MG tablet Take by mouth at bedtime.     aspirin 81 MG tablet Take 81 mg by mouth daily.     cholecalciferol (VITAMIN D) 1000 units tablet Take 1,000 Units by mouth daily.     diltiazem (TIAZAC) 180 MG 24 hr capsule Take 180 mg by mouth daily.  HYDROcodone-acetaminophen (NORCO) 5-325 MG tablet Take 1 tablet by mouth every 6 (six) hours as needed for moderate pain. 20 tablet 0   HYDROcodone-acetaminophen (NORCO/VICODIN) 5-325 MG per tablet Take 1 tablet by mouth every 6 (six) hours as needed for pain.     meloxicam (MOBIC) 7.5 MG tablet Take 7.5 mg by mouth daily.     quinapril (ACCUPRIL) 10 MG tablet Take 10 mg by mouth at bedtime.     UNABLE TO FIND PK stimulant     vitamin B-12 (CYANOCOBALAMIN) 100 MCG tablet Take 100 mcg by mouth daily.     No current facility-administered medications for this visit.   No results found.  Review of Systems:   A ROS was performed including pertinent positives and negatives as documented in the HPI.  Physical Exam :   Constitutional: NAD and appears stated age Neurological: Alert and oriented Psych: Appropriate affect and cooperative There were no vitals taken for this visit.   Comprehensive Musculoskeletal Exam:     Musculoskeletal Exam    Inspection Right Left  Skin No atrophy or winging No atrophy or winging  Palpation    Tenderness Glenohumeral lateral deltoid None  Range of Motion    Flexion (passive) 130 170  Flexion (active) 30 170  Abduction 20 170  ER at the side 10 70  Can reach behind back to Back pocket T12  Strength     Limited with positive belly press Full  Special Tests    Pseudoparalytic No No  Neurologic    Fires PIN, radial, median, ulnar, musculocutaneous, axillary, suprascapular, long thoracic, and spinal accessory innervated muscles. No abnormal sensibility  Vascular/Lymphatic    Radial Pulse 2+ 2+  Cervical Exam    Patient has symmetric cervical range of motion with negative Spurling's test.  Special Test: Positive pseudoparalysis     Imaging:   Xray (3 views right shoulder): There are calcifications involving the rotator cuff musculature with mild glenohumeral osteoarthritis   I personally reviewed and interpreted the radiographs.   Assessment:   71 year old male with right shoulder weakness and pain after changing his leg notes 1 week prior.  At this time I am concerned that he is really torn his rotator cuff repair.  Given this I would like to obtain an MRI to see if this is occurred.  We did discuss that if he does have a retear of mood further need to discuss additional treatment options and possibly surgery.  He will follow-up after this to discuss results  Plan :    -Plan for right shoulder MRI   I believe that advance imaging in the form of an MRI is indicated for the following reasons: -Xrays images were obtained and not diagnostic -The patient has failed treatment modalities including rest, ice -The following worrisome symptoms are present on history and exam: Acute loss of motion following a surgical rotator cuff repair, need to specifically check on the status of his repair - MRI is required to assist in specific surgical planning       I  personally saw and evaluated the patient, and participated in the management and treatment plan.  Huel Cote, MD Attending Physician, Orthopedic Surgery  This document was dictated using Dragon voice recognition software. A reasonable attempt at proof reading has been made to minimize errors.

## 2021-04-15 ENCOUNTER — Other Ambulatory Visit: Payer: Self-pay

## 2021-04-15 ENCOUNTER — Ambulatory Visit
Admission: RE | Admit: 2021-04-15 | Discharge: 2021-04-15 | Disposition: A | Discharge status: Home / Self Care | Payer: Medicare HMO | Source: Ambulatory Visit | Attending: Orthopaedic Surgery | Admitting: Orthopaedic Surgery

## 2021-04-15 DIAGNOSIS — M25511 Pain in right shoulder: Secondary | ICD-10-CM

## 2021-04-15 DIAGNOSIS — M19011 Primary osteoarthritis, right shoulder: Secondary | ICD-10-CM | POA: Diagnosis not present

## 2021-04-28 ENCOUNTER — Ambulatory Visit (HOSPITAL_BASED_OUTPATIENT_CLINIC_OR_DEPARTMENT_OTHER): Payer: Medicare HMO | Admitting: Orthopaedic Surgery

## 2021-04-28 ENCOUNTER — Other Ambulatory Visit: Payer: Self-pay

## 2021-04-28 DIAGNOSIS — S46011A Strain of muscle(s) and tendon(s) of the rotator cuff of right shoulder, initial encounter: Secondary | ICD-10-CM | POA: Diagnosis not present

## 2021-04-28 NOTE — Progress Notes (Signed)
Chief Complaint: Right shoulder pain     History of Present Illness:   04/28/2021:  presents today for MRI follow-up.  He states the right shoulder is consistently painful with limited overhead activity.  He has had no improvement since last visit.   Kevin Bartlett is a 71 y.o. male right-hand-dominant male presents with right shoulder pain approximately 1 week with inability to lift arm.  He states that he was needing to change lug knots on his tire and subsequently noticed limited range of motion about the right arm.  He states he did have a rotator cuff repair done 15 years prior which was doing well until a week prior.  He states that he is having throbbing pain with laying directly on the side.  He is no longer able to actively abduct the arm.  He does smoke a cigar daily.    Surgical History:   None  PMH/PSH/Family History/Social History/Meds/Allergies:    Past Medical History:  Diagnosis Date   Arthritis    back, knees   Asthma    Depression    Erectile dysfunction `   HTN (hypertension)    OSA (obstructive sleep apnea)    uses CPAP most of time   Spinal stenosis    Past Surgical History:  Procedure Laterality Date   APPENDECTOMY     CARPAL TUNNEL RELEASE Left 03/22/2016   Procedure: LEFT CARPAL TUNNEL RELEASE FASCIECTOMY LEFT RING FINGER;  Surgeon: Cindee Salt, MD;  Location: [facility];  Service: Orthopedics;  Laterality: Left;  AXILLARY BLOCK   CARPAL TUNNEL RELEASE Right 05/15/2016   Procedure: RIGHT CARPAL TUNNEL RELEASE;  Surgeon: Cindee Salt, MD;  Location: [facility];  Service: Orthopedics;  Laterality: Right;   FASCIECTOMY Left 03/22/2016   Procedure: FASCIECTOMY LEFT RING FINGER;  Surgeon: Cindee Salt, MD;  Location: [facility];  Service: Orthopedics;  Laterality: Left;   INGUINAL HERNIA REPAIR Bilateral    Social History   Socioeconomic History   Marital status: Single    Spouse  name: Not on file   Number of children: Not on file   Years of education: Not on file   Highest education level: Not on file  Occupational History   Not on file  Tobacco Use   Smoking status: Former    Types: Cigars    Quit date: 01/16/2003    Years since quitting: 18.2   Smokeless tobacco: Never  Substance and Sexual Activity   Alcohol use: Yes    Comment: social   Drug use: No   Sexual activity: Not on file  Other Topics Concern   Not on file  Social History Narrative   Not on file   Social Determinants of Health   Financial Resource Strain: Not on file  Food Insecurity: Not on file  Transportation Needs: Not on file  Physical Activity: Not on file  Stress: Not on file  Social Connections: Not on file   Family History  Problem Relation Age of Onset   Heart disease Father    No Known Allergies Current Outpatient Medications  Medication Sig Dispense Refill   amitriptyline (ELAVIL) 75 MG tablet Take by mouth at bedtime.     aspirin 81 MG tablet Take 81 mg by mouth daily.     cholecalciferol (VITAMIN D)  1000 units tablet Take 1,000 Units by mouth daily.     diltiazem (TIAZAC) 180 MG 24 hr capsule Take 180 mg by mouth daily.     HYDROcodone-acetaminophen (NORCO) 5-325 MG tablet Take 1 tablet by mouth every 6 (six) hours as needed for moderate pain. 20 tablet 0   HYDROcodone-acetaminophen (NORCO/VICODIN) 5-325 MG per tablet Take 1 tablet by mouth every 6 (six) hours as needed for pain.     meloxicam (MOBIC) 7.5 MG tablet Take 7.5 mg by mouth daily.     quinapril (ACCUPRIL) 10 MG tablet Take 10 mg by mouth at bedtime.     UNABLE TO FIND PK stimulant     vitamin B-12 (CYANOCOBALAMIN) 100 MCG tablet Take 100 mcg by mouth daily.     No current facility-administered medications for this visit.   No results found.  Review of Systems:   A ROS was performed including pertinent positives and negatives as documented in the HPI.  Physical Exam :   Constitutional: NAD and  appears stated age Neurological: Alert and oriented Psych: Appropriate affect and cooperative There were no vitals taken for this visit.   Comprehensive Musculoskeletal Exam:    Musculoskeletal Exam    Inspection Right Left  Skin No atrophy or winging No atrophy or winging  Palpation    Tenderness Glenohumeral lateral deltoid None  Range of Motion    Flexion (passive) 130 170  Flexion (active) 30 170  Abduction 20 170  ER at the side 10 70  Can reach behind back to Back pocket T12  Strength     Limited with positive belly press Full  Special Tests    Pseudoparalytic No No  Neurologic    Fires PIN, radial, median, ulnar, musculocutaneous, axillary, suprascapular, long thoracic, and spinal accessory innervated muscles. No abnormal sensibility  Vascular/Lymphatic    Radial Pulse 2+ 2+  Cervical Exam    Patient has symmetric cervical range of motion with negative Spurling's test.  Special Test: Positive pseudoparalysis     Imaging:   Xray (3 views right shoulder): There are calcifications involving the rotator cuff musculature with mild glenohumeral osteoarthritis  MRI right shoulder: There is partial thickness tear involving his previous rotator cuff repair of the supraspinatus.  The tendon is thinned in this area.  Mild glenohumeral osteoarthritis  I personally reviewed and interpreted the radiographs.   Assessment:   71 year old male with right shoulder weakness and pain after working on his car.  Overall I described that the MRI does show evidence of a retear of his rotator cuff repair although this is only in a partial fashion.  As result I do believe that prior to further discussion of surgery, physical therapy in order to work on his motion and strengthening is indicated in order to improve the status of his shoulder.  Did discuss that you might ultimately be a candidate for surgery if he does not feel better with physical therapy.  Plan :    -Physical therapy  ordered for right shoulder -Return to clinic in 4 weeks to discuss results     I personally saw and evaluated the patient, and participated in the management and treatment plan.  Huel Cote, MD Attending Physician, Orthopedic Surgery  This document was dictated using Dragon voice recognition software. A reasonable attempt at proof reading has been made to minimize errors.

## 2021-05-05 ENCOUNTER — Other Ambulatory Visit: Payer: Self-pay

## 2021-05-05 ENCOUNTER — Ambulatory Visit (HOSPITAL_BASED_OUTPATIENT_CLINIC_OR_DEPARTMENT_OTHER): Payer: Medicare HMO | Attending: Orthopaedic Surgery | Admitting: Physical Therapy

## 2021-05-05 ENCOUNTER — Encounter (HOSPITAL_BASED_OUTPATIENT_CLINIC_OR_DEPARTMENT_OTHER): Payer: Self-pay | Admitting: Physical Therapy

## 2021-05-05 DIAGNOSIS — M6281 Muscle weakness (generalized): Secondary | ICD-10-CM | POA: Diagnosis not present

## 2021-05-05 DIAGNOSIS — Y939 Activity, unspecified: Secondary | ICD-10-CM | POA: Diagnosis not present

## 2021-05-05 DIAGNOSIS — M25511 Pain in right shoulder: Secondary | ICD-10-CM | POA: Diagnosis not present

## 2021-05-05 DIAGNOSIS — M25611 Stiffness of right shoulder, not elsewhere classified: Secondary | ICD-10-CM | POA: Insufficient documentation

## 2021-05-05 DIAGNOSIS — S46011A Strain of muscle(s) and tendon(s) of the rotator cuff of right shoulder, initial encounter: Secondary | ICD-10-CM | POA: Diagnosis not present

## 2021-05-05 DIAGNOSIS — G8929 Other chronic pain: Secondary | ICD-10-CM | POA: Insufficient documentation

## 2021-05-05 NOTE — Therapy (Addendum)
[facility] OUTPATIENT PHYSICAL THERAPY SHOULDER EVALUATION   Patient Name: Kevin Bartlett MRN: [id number] DOB:10/02/1950, 71 y.o., male Today's Date: 05/05/2021   PT End of Session - 05/05/21 1122     Visit Number 1    Number of Visits 12    Date for PT Re-Evaluation 06/16/21    PT Start Time 1015    PT Stop Time 1058    PT Time Calculation (min) 43 min    Activity Tolerance Patient tolerated treatment well    Behavior During Therapy WFL for tasks assessed/performed             Past Medical History:  Diagnosis Date   Arthritis    back, knees   Asthma    Depression    Erectile dysfunction `   HTN (hypertension)    OSA (obstructive sleep apnea)    uses CPAP most of time   Spinal stenosis    Past Surgical History:  Procedure Laterality Date   APPENDECTOMY     CARPAL TUNNEL RELEASE Left 03/22/2016   Procedure: LEFT CARPAL TUNNEL RELEASE FASCIECTOMY LEFT RING FINGER;  Surgeon: Cindee SaltGary Kuzma, MD;  Location: [facility];  Service: Orthopedics;  Laterality: Left;  AXILLARY BLOCK   CARPAL TUNNEL RELEASE Right 05/15/2016   Procedure: RIGHT CARPAL TUNNEL RELEASE;  Surgeon: Cindee SaltGary Kuzma, MD;  Location: [facility];  Service: Orthopedics;  Laterality: Right;   FASCIECTOMY Left 03/22/2016   Procedure: FASCIECTOMY LEFT RING FINGER;  Surgeon: Cindee SaltGary Kuzma, MD;  Location: [facility];  Service: Orthopedics;  Laterality: Left;   INGUINAL HERNIA REPAIR Bilateral    Patient Active Problem List   Diagnosis Date Noted   Obesity (BMI 30-39.9)    HTN (hypertension)    Erectile dysfunction    OSA (obstructive sleep apnea)    Guaiac positive stools    Asthma    Depression    Spinal stenosis     PCP: Blair HeysEhinger, Robert, MD  REFERRING PROVIDER: Huel CoteBokshan, Steven, MD  REFERRING DIAG: right shoulder pain   THERAPY DIAG:  Chronic right shoulder pain  Stiffness of right shoulder, not elsewhere classified  Muscle weakness (generalized)   ONSET DATE: Late  December   SUBJECTIVE:                                                                                                                                                                                      SUBJECTIVE STATEMENT: Patient has been having pain in his shoulder around the end of December. He was changing lug nuts. He has a history of shoulder surgery 15 years ago. The pain has improve, but he is still limited  in function.    PERTINENT HISTORY: Spinal stenosis; carpal tunnel release on both sides. Depression .   PAIN:  Are you having pain? Yes NPRS scale: 3/10 Pain location: right shoulder  Pain orientation: Right and Posterior  PAIN TYPE: aching Pain description: intermittent  Aggravating factors: use of the shoulder  Relieving factors: ibuprofen   PRECAUTIONS: None ( smokes Cigars)   WEIGHT BEARING RESTRICTIONS No  FALLS:  Has patient fallen in last 6 months? No Number of falls:   LIVING ENVIRONMENT: Nothing pertinent  OCCUPATION: Retired   Hobbies:  Plays golf    PLOF: Independent  PATIENT GOALS   To improve the use of right hand.   OBJECTIVE:   DIAGNOSTIC FINDINGS:  IMPRESSION: 1. Prior rotator cuff repair. Severe supraspinatus tendinosis with small low-grade partial-thickness articular surface tear of the distal tendon at the insertion. 2. Moderate subscapularis tendinosis with low-grade distal intrasubstance tear. 3. Severe infraspinatus tendinosis. 4. Mild intra-articular biceps tendinosis. 5. Mild glenohumeral osteoarthritis.  PATIENT SURVEYS:  FOTO not given   COGNITION:  Overall cognitive status: Within functional limits for tasks assessed     SENSATION:  Light touch: Appears intact  Stereognosis: Appears intact  Hot/Cold: Appears intact  Proprioception: Appears intact Denies paresthesias  POSTURE: Good   UPPER EXTREMITY AROM/PROM:  PROM Right 05/05/2021 Left 05/05/2021  Shoulder flexion    Shoulder extension    Shoulder  abduction    Shoulder adduction    Shoulder internal rotation    Shoulder external rotation    Elbow flexion    Elbow extension    Wrist flexion    Wrist extension    Wrist ulnar deviation    Wrist radial deviation    Wrist pronation    Wrist supination    (Blank rows = not tested)   AROM Right 05/05/2021 Left 05/05/2021  Shoulder flexion 85 with mild pain  WNL  Shoulder extension    Shoulder abduction    Shoulder adduction    Shoulder internal rotation    Shoulder external rotation Can not reach the back of his head  WNL    Elbow flexion To upper gluteal with pain  WNL  To t12   Elbow extension    Wrist flexion    Wrist extension    Wrist ulnar deviation    Wrist radial deviation    Wrist pronation    Wrist supination    (Blank rows = not tested)  UPPER EXTREMITY MMT:  MMT Right 05/05/2021 Left 05/05/2021  Shoulder flexion 3/5 to 90  5/5  Shoulder extension    Shoulder abduction    Shoulder adduction    Shoulder internal rotation 4/5 5/5  Shoulder external rotation 3+/5 5/5  Middle trapezius    Lower trapezius    Elbow flexion    Elbow extension    Wrist flexion    Wrist extension    Wrist ulnar deviation    Wrist radial deviation    Wrist pronation    Wrist supination    Grip strength (lbs)    (Blank rows = not tested)  SHOULDER SPECIAL TESTS: Not performed. Patient has pain with all active movements and MRI already performed   PALPATION: tight upper trap Trigger point in sub scap and infraspinatus area  No TTP in bicpes groove     TODAY'S TREATMENT:     Exercises Supine Shoulder Flexion Extension AAROM with Dowel - 1 x daily - 7 x weekly - 3 sets - 10 reps Shoulder extension with resistance -  Neutral - 1 x daily - 7 x weekly - 3 sets - 10 reps Scapular Retraction with Resistance - 1 x daily - 7 x weekly - 3 sets - 10 reps Standing Shoulder Internal Rotation with Anchored Resistance - 1 x daily - 7 x weekly - 3 sets - 10 reps   PATIENT  EDUCATION: Education details: reviewed HEP and symptom management; scapular strengthening Person educated: Patient Education method: Explanation, Demonstration, Tactile cues, Verbal cues, and Handouts Education comprehension: verbalized understanding, returned demonstration, verbal cues required, tactile cues required, and needs further education   HOME EXERCISE PROGRAM: Access Code: [email or web address] URL: [email or web address] Date: 05/05/2021 Prepared by: Lorayne Bender  Exercises Supine Shoulder Flexion Extension AAROM with Dowel 3 sets - 10 reps Shoulder extension with resistance - Neutral  3 sets - 10 reps Scapular Retraction with Resistance  3 sets - 10 reps Standing Shoulder Internal Rotation with Anchored Resistance 3 sets - 10 reps   ASSESSMENT:  CLINICAL IMPRESSION: Patient is a 71 y.o. male who was seen today for physical therapy evaluation and treatment for right shoulder pain. He presents with decreased active motion with all motions. His passive is only mildly limited    OBJECTIVE IMPAIRMENTS decreased activity tolerance, decreased ROM, decreased strength, increased fascial restrictions, impaired UE functional use, and pain.   ACTIVITY LIMITATIONS cleaning, meal prep, occupation, yard work, and shopping.   PERSONAL FACTORS 1-2 comorbidities: spinal stenosis and depression   are also affecting patient's functional outcome.    REHAB POTENTIAL: Excellent  CLINICAL DECISION MAKING: Stable/uncomplicated improving pain over the past few weeks, just limitations in fucntion   EVALUATION COMPLEXITY: Low   GOALS: Goals reviewed with patient? Yes  SHORT TERM GOALS:  STG Name Target Date Goal status  1 Patient will increase shoulder flexion strength past 90 degrees to 4+/5 Baseline:  05/26/2021 INITIAL  2 Patient will increase active left shoulder flexion to 120 degrees  Baseline:  05/26/2021 INITIAL  3 Patient will be independent with basic HEP  Baseline:  05/26/2021 INITIAL  LONG TERM GOALS:   LTG Name Target Date Goal status  1 Patient will increase gross left shoulder strength to 4+/5 in order to use his right arm for ADL's  Baseline: 06/16/2021 INITIAL  2 Patient will return to golf  Baseline: 06/16/2021 INITIAL  3 Patient will reach behind his head without pain  Baseline: 06/16/2021 INITIAL  PLAN: PT FREQUENCY: 1-2x/week  PT DURATION: 6 weeks  PLANNED INTERVENTIONS: Therapeutic exercises, Therapeutic activity, Neuro Muscular re-education, Patient/Family education, Joint mobilization, Electrical stimulation, Cryotherapy, Moist heat, Ultrasound, and Manual therapy  PLAN FOR NEXT SESSION: assess tolerance to HEP; if he tolerated well add in side lying ER, supine ABC( likely no weight) , rhythmic stabilization for ER and flexion if tolerated. advance extension and scap retraction. Work on trigger point release to upper trap and posterior RTC area if needed. Progress from gravity eliminated as tolerated but may take a few visits. He has small RTC tears but has moderate to severe tendinosis in several muscles. Continue to educate the patient on symptom management when doing his exercises. Progress back to golf specific movements as tolerated.   Referring diagnosis? S46.011A (ICD-10-CM) - Traumatic complete tear of right rotator cuff, initial encounter Treatment diagnosis? (if different than referring diagnosis) S46.011A (ICD-10-CM) - Traumatic complete tear of right rotator cuff, initial encounter 2/17- 3/31  12 visits   What was this (referring dx) caused by? []  Surgery []  Fall [x]  Ongoing issue []  Arthritis []  Other: ____________  Laterality: []  Rt [x]  Lt []  Both  Check all possible CPT codes:  *CHOOSE 10 OR LESS*    []  97110 (Therapeutic Exercise)  []  92507 (SLP Treatment)  []  97112 (Neuro Re-ed)   []  92526 (Swallowing Treatment)   []  97116 (Gait Training)   []  (Cognitive Training, 1st 15 minutes) []  97140 (Manual  Therapy)   []  97130 (Cognitive Training, each add'l 15 minutes)  []  97530 (Therapeutic Activities)  []  Other, List CPT Code ____________    []  (Self Care)       [x]  All codes above (97110 - 97535)  []  97012 (Mechanical Traction)  [x]  97014 (E-stim Unattended)  []  97032 (E-stim manual)  []  97033 (Ionto)  [x]  97035 (Ultrasound)  []  97760 (Orthotic Fit) []  (Physical Performance Training) []  (Aquatic Therapy) []  97034 (Contrast Bath) []  (Paraffin) []  97597 (Wound Care 1st 20 sq cm) []  97598 (Wound Care each add'l 20 sq cm) []  97016 (Vasopneumatic Device) []  (Orthotic Training) []  (Prosthetic Training)  , PT 05/05/2021, 12:49 PM

## 2021-05-05 NOTE — Addendum Note (Signed)
Addended by: Carney Living on: 05/05/2021 12:50 PM   Modules accepted: Orders

## 2021-05-10 ENCOUNTER — Ambulatory Visit (HOSPITAL_BASED_OUTPATIENT_CLINIC_OR_DEPARTMENT_OTHER): Payer: Medicare HMO | Admitting: Physical Therapy

## 2021-05-10 ENCOUNTER — Other Ambulatory Visit: Payer: Self-pay

## 2021-05-10 ENCOUNTER — Encounter (HOSPITAL_BASED_OUTPATIENT_CLINIC_OR_DEPARTMENT_OTHER): Payer: Self-pay | Admitting: Physical Therapy

## 2021-05-10 DIAGNOSIS — M25611 Stiffness of right shoulder, not elsewhere classified: Secondary | ICD-10-CM

## 2021-05-10 DIAGNOSIS — S46011A Strain of muscle(s) and tendon(s) of the rotator cuff of right shoulder, initial encounter: Secondary | ICD-10-CM | POA: Diagnosis not present

## 2021-05-10 DIAGNOSIS — M25511 Pain in right shoulder: Secondary | ICD-10-CM | POA: Diagnosis not present

## 2021-05-10 DIAGNOSIS — M6281 Muscle weakness (generalized): Secondary | ICD-10-CM

## 2021-05-10 DIAGNOSIS — G8929 Other chronic pain: Secondary | ICD-10-CM | POA: Diagnosis not present

## 2021-05-10 NOTE — Therapy (Signed)
OUTPATIENT PHYSICAL THERAPY SHOULDER TREATMENT   Patient Name: Kevin CAHALAN MRN: [id number] DOB:1950/09/18, 71 y.o., male Today's Date: 05/10/2021   PT End of Session - 05/10/21 1014     Visit Number 2    Number of Visits 12    Date for PT Re-Evaluation 06/16/21    PT Start Time 0934    PT Stop Time 1014    PT Time Calculation (min) 40 min    Activity Tolerance Patient tolerated treatment well    Behavior During Therapy WFL for tasks assessed/performed              Past Medical History:  Diagnosis Date   Arthritis    back, knees   Asthma    Depression    Erectile dysfunction `   HTN (hypertension)    OSA (obstructive sleep apnea)    uses CPAP most of time   Spinal stenosis    Past Surgical History:  Procedure Laterality Date   APPENDECTOMY     CARPAL TUNNEL RELEASE Left 03/22/2016   Procedure: LEFT CARPAL TUNNEL RELEASE FASCIECTOMY LEFT RING FINGER;  Surgeon: Cindee Salt, MD;  Location: [facility];  Service: Orthopedics;  Laterality: Left;  AXILLARY BLOCK   CARPAL TUNNEL RELEASE Right 05/15/2016   Procedure: RIGHT CARPAL TUNNEL RELEASE;  Surgeon: Cindee Salt, MD;  Location: [facility];  Service: Orthopedics;  Laterality: Right;   FASCIECTOMY Left 03/22/2016   Procedure: FASCIECTOMY LEFT RING FINGER;  Surgeon: Cindee Salt, MD;  Location: [facility];  Service: Orthopedics;  Laterality: Left;   INGUINAL HERNIA REPAIR Bilateral    Patient Active Problem List   Diagnosis Date Noted   Obesity (BMI 30-39.9)    HTN (hypertension)    Erectile dysfunction    OSA (obstructive sleep apnea)    Guaiac positive stools    Asthma    Depression    Spinal stenosis     PCP: Blair Heys, MD  REFERRING PROVIDER: Blair Heys, MD  REFERRING DIAG: right shoulder pain   THERAPY DIAG:  Chronic right shoulder pain  Stiffness of right shoulder, not elsewhere classified  Muscle weakness (generalized)   ONSET DATE: Late  December   SUBJECTIVE:                                                                                                                                                                                      SUBJECTIVE STATEMENT: Pt reports a little stiffness in Rt shoulder, but no pain.    PERTINENT HISTORY: Spinal stenosis; carpal tunnel release on both sides. Depression .   PAIN:  Are you having pain? no NPRS scale:  0/10 Pain location: right shoulder  Pain orientation: Right and Posterior  PAIN TYPE:  Pain description: intermittent  Aggravating factors: use of the shoulder  Relieving factors: ibuprofen   PRECAUTIONS: None ( smokes Cigars)   WEIGHT BEARING RESTRICTIONS No  FALLS:  Has patient fallen in last 6 months? No Number of falls:   LIVING ENVIRONMENT: Nothing pertinent  OCCUPATION: Retired   Hobbies:  Plays golf    PLOF: Independent  PATIENT GOALS   To improve the use of right hand.   OBJECTIVE:     AROM Right 05/05/2021 Left 05/05/2021  Shoulder flexion 85 with mild pain  WNL  Shoulder extension    Shoulder abduction    Shoulder adduction    Shoulder internal rotation    Shoulder external rotation Can not reach the back of his head  WNL    Elbow flexion To upper gluteal with pain  WNL  To t12   Elbow extension    Wrist flexion    Wrist extension    Wrist ulnar deviation    Wrist radial deviation    Wrist pronation    Wrist supination    (Blank rows = not tested)  UPPER EXTREMITY MMT:  MMT Right 2/17/ 2023 Left 05/05/2021  Shoulder flexion 3/5 to 90  5/5  Shoulder extension    Shoulder abduction    Shoulder adduction    Shoulder internal rotation 4/5 5/5  Shoulder external rotation 3+/5 5/5  Middle trapezius    Lower trapezius    Elbow flexion    Elbow extension    Wrist flexion    Wrist extension    Wrist ulnar deviation    Wrist radial deviation    Wrist pronation    Wrist supination    Grip strength (lbs)    (Blank rows =  not tested)  SHOULDER SPECIAL TESTS: Not performed. Patient has pain with all active movements and MRI already performed   PALPATION: tight upper trap Trigger point in sub scap and infraspinatus area  No TTP in bicpes groove     TODAY'S TREATMENT:  05/10/21:   Supine Shoulder Flexion Extension AAROM with Dowel   Supine Shoulder diagonals AAROM with dowel x 10   Supine stargazer stretch x 30 seconds    Sidelying RUE ER with 1# x 12   Seated bilat ER with red band x 10 reps   Standing shoulder ext with red band x 10 x 2 sets   Standing bilat row with red band x 10 x 2 sets   Standing finger wall walking x 5 reps, 2 sets, on cabinets.    Standing Lt shoulder flexion to 60 with 1# x 10   Shoulder ext with cane behind back AAROM x 10    Shoulder IR with cane behind back AAROM x 10   Supine bilat ER with yellow band x 10 reps   Supine snow angels with bilat arms for pec stretch x 8 reps   Eval Exercises: Supine Shoulder Flexion Extension AAROM with Dowel - 1 x daily - 7 x weekly - 3 sets - 10 reps Shoulder extension with resistance - Neutral - 1 x daily - 7 x weekly - 3 sets - 10 reps Scapular Retraction with Resistance - 1 x daily - 7 x weekly - 3 sets - 10 reps Standing Shoulder Internal Rotation with Anchored Resistance - 1 x daily - 7 x weekly - 3 sets - 10 reps   PATIENT EDUCATION: Education details: reviewed HEP and  symptom management; importance of posture and scapular strengthening Person educated: Patient Education method: Explanation, Demonstration, Tactile cues, Verbal cues, Education comprehension: verbalized understanding, returned demonstration, verbal cues required, tactile cues required, and needs further education   HOME EXERCISE PROGRAM: Access Code: [email or web address] URL: [email or web address] Date: 05/05/2021 Prepared by: Lorayne Bender  Exercises Supine Shoulder Flexion Extension AAROM with Dowel 3 sets - 10 reps Shoulder extension with resistance -  Neutral  3 sets - 10 reps Scapular Retraction with Resistance  3 sets - 10 reps Standing Shoulder Internal Rotation with Anchored Resistance 3 sets - 10 reps   ASSESSMENT:  CLINICAL IMPRESSION: Pt demonstrates limited Rt shoulder flexion strength over ~60 deg; demonstrates compensatory movements for reaching overhead.  He completes finger walking without hiking shoulder, but once attempting to lift RUE off of supportive surface, he reports increased pain and difficulty.  He required minor cues on form with exercises.  He tolerated exercises without increase in pain (per report), despite grimace on face with exertion. Goals are ongoing at this time.    OBJECTIVE IMPAIRMENTS decreased activity tolerance, decreased ROM, decreased strength, increased fascial restrictions, impaired UE functional use, and pain.   ACTIVITY LIMITATIONS cleaning, meal prep, occupation, yard work, and shopping.   PERSONAL FACTORS 1-2 comorbidities: spinal stenosis and depression   are also affecting patient's functional outcome.    REHAB POTENTIAL: Excellent  CLINICAL DECISION MAKING: Stable/uncomplicated improving pain over the past few weeks, just limitations in fucntion   EVALUATION COMPLEXITY: Low   GOALS: Goals reviewed with patient? Yes  SHORT TERM GOALS:  STG Name Target Date Goal status  1 Patient will increase shoulder flexion strength past 90 degrees to 4+/5 Baseline:  05/31/2021 INITIAL  2 Patient will increase active left shoulder flexion to 120 degrees  Baseline:  05/31/2021 INITIAL  3 Patient will be independent with basic HEP  Baseline: 05/31/2021 INITIAL  LONG TERM GOALS:   LTG Name Target Date Goal status  1 Patient will increase gross left shoulder strength to 4+/5 in order to use his right arm for ADL's  Baseline: 06/21/2021 INITIAL  2 Patient will return to golf  Baseline: 06/21/2021 INITIAL  3 Patient will reach behind his head without pain  Baseline: 06/21/2021 INITIAL  PLAN: PT  FREQUENCY: 1-2x/week  PT DURATION: 6 weeks  PLANNED INTERVENTIONS: Therapeutic exercises, Therapeutic activity, Neuro Muscular re-education, Patient/Family education, Joint mobilization, Electrical stimulation, Cryotherapy, Moist heat, Ultrasound, and Manual therapy  PLAN FOR NEXT SESSION: assess tolerance to HEP; if he tolerated well add in side lying ER, supine ABC( likely no weight) , rhythmic stabilization for ER and flexion if tolerated. advance extension and scap retraction. Work on trigger point release to upper trap and posterior RTC area if needed. Progress from gravity eliminated as tolerated but may take a few visits. He has small RTC tears but has moderate to severe tendinosis in several muscles. Continue to educate the patient on symptom management when doing his exercises. Progress back to golf specific movements as tolerated.   Mayer Camel, PTA 05/10/21 4:55 PM

## 2021-05-17 DIAGNOSIS — G4733 Obstructive sleep apnea (adult) (pediatric): Secondary | ICD-10-CM | POA: Diagnosis not present

## 2021-05-17 DIAGNOSIS — M48061 Spinal stenosis, lumbar region without neurogenic claudication: Secondary | ICD-10-CM | POA: Diagnosis not present

## 2021-05-17 DIAGNOSIS — I10 Essential (primary) hypertension: Secondary | ICD-10-CM | POA: Diagnosis not present

## 2021-05-17 DIAGNOSIS — M545 Low back pain, unspecified: Secondary | ICD-10-CM | POA: Diagnosis not present

## 2021-05-17 DIAGNOSIS — M5416 Radiculopathy, lumbar region: Secondary | ICD-10-CM | POA: Diagnosis not present

## 2021-05-17 DIAGNOSIS — D692 Other nonthrombocytopenic purpura: Secondary | ICD-10-CM | POA: Diagnosis not present

## 2021-05-17 DIAGNOSIS — Z720 Tobacco use: Secondary | ICD-10-CM | POA: Diagnosis not present

## 2021-05-17 DIAGNOSIS — G894 Chronic pain syndrome: Secondary | ICD-10-CM | POA: Diagnosis not present

## 2021-05-17 DIAGNOSIS — E781 Pure hyperglyceridemia: Secondary | ICD-10-CM | POA: Diagnosis not present

## 2021-05-26 ENCOUNTER — Encounter (HOSPITAL_BASED_OUTPATIENT_CLINIC_OR_DEPARTMENT_OTHER): Payer: Self-pay | Admitting: Physical Therapy

## 2021-05-26 ENCOUNTER — Ambulatory Visit (HOSPITAL_BASED_OUTPATIENT_CLINIC_OR_DEPARTMENT_OTHER): Payer: Medicare HMO | Attending: Orthopaedic Surgery | Admitting: Physical Therapy

## 2021-05-26 ENCOUNTER — Other Ambulatory Visit: Payer: Self-pay

## 2021-05-26 ENCOUNTER — Ambulatory Visit (HOSPITAL_BASED_OUTPATIENT_CLINIC_OR_DEPARTMENT_OTHER): Payer: Medicare HMO | Admitting: Orthopaedic Surgery

## 2021-05-26 DIAGNOSIS — M25611 Stiffness of right shoulder, not elsewhere classified: Secondary | ICD-10-CM | POA: Insufficient documentation

## 2021-05-26 DIAGNOSIS — M25511 Pain in right shoulder: Secondary | ICD-10-CM

## 2021-05-26 DIAGNOSIS — S46011A Strain of muscle(s) and tendon(s) of the rotator cuff of right shoulder, initial encounter: Secondary | ICD-10-CM | POA: Diagnosis not present

## 2021-05-26 DIAGNOSIS — G8929 Other chronic pain: Secondary | ICD-10-CM | POA: Insufficient documentation

## 2021-05-26 DIAGNOSIS — M6281 Muscle weakness (generalized): Secondary | ICD-10-CM | POA: Diagnosis not present

## 2021-05-26 NOTE — Therapy (Signed)
OUTPATIENT PHYSICAL THERAPY SHOULDER TREATMENT   Patient Name: Kevin Bartlett MRN: [id number] DOB:[birth year], 71 y.o., male Today's Date: 05/26/2021   PT End of Session - 05/26/21 1540     Visit Number 3    Number of Visits 12    Date for PT Re-Evaluation 06/16/21    PT Start Time 0845    PT Stop Time 0927    PT Time Calculation (min) 42 min    Activity Tolerance Patient tolerated treatment well    Behavior During Therapy WFL for tasks assessed/performed               Past Medical History:  Diagnosis Date   Arthritis    back, knees   Asthma    Depression    Erectile dysfunction `   HTN (hypertension)    OSA (obstructive sleep apnea)    uses CPAP most of time   Spinal stenosis    Past Surgical History:  Procedure Laterality Date   APPENDECTOMY     CARPAL TUNNEL RELEASE Left 03/22/2016   Procedure: LEFT CARPAL TUNNEL RELEASE FASCIECTOMY LEFT RING FINGER;  Surgeon: Cindee Salt, MD;  Location: [facility];  Service: Orthopedics;  Laterality: Left;  AXILLARY BLOCK   CARPAL TUNNEL RELEASE Right 05/15/2016   Procedure: RIGHT CARPAL TUNNEL RELEASE;  Surgeon: Cindee Salt, MD;  Location: [facility];  Service: Orthopedics;  Laterality: Right;   FASCIECTOMY Left 03/22/2016   Procedure: FASCIECTOMY LEFT RING FINGER;  Surgeon: Cindee Salt, MD;  Location: [facility];  Service: Orthopedics;  Laterality: Left;   INGUINAL HERNIA REPAIR Bilateral    Patient Active Problem List   Diagnosis Date Noted   Obesity (BMI 30-39.9)    HTN (hypertension)    Erectile dysfunction    OSA (obstructive sleep apnea)    Guaiac positive stools    Asthma    Depression    Spinal stenosis     PCP: Blair Heys, MD  REFERRING PROVIDER: Blair Heys, MD  REFERRING DIAG: right shoulder pain   THERAPY DIAG:  Chronic right shoulder pain  Stiffness of right shoulder, not elsewhere classified  Muscle weakness (generalized)   ONSET DATE: Late  December   SUBJECTIVE:                                                                                                                                                                                      SUBJECTIVE STATEMENT: Patient played golf the other day. He reported minor shoulder pain. He reports more pain in the back then the shoulder. He wasn't able to hit it as far.    PERTINENT HISTORY: Spinal stenosis;  carpal tunnel release on both sides. Depression .   PAIN:  Are you having pain? no NPRS scale: 0/10 Pain location: right shoulder  Pain orientation: Right and Posterior  PAIN TYPE:  Pain description: intermittent  Aggravating factors: use of the shoulder  Relieving factors: ibuprofen   PRECAUTIONS: None ( smokes Cigars)   WEIGHT BEARING RESTRICTIONS No  FALLS:  Has patient fallen in last 6 months? No Number of falls:   LIVING ENVIRONMENT: Nothing pertinent  OCCUPATION: Retired   Hobbies:  Plays golf    PLOF: Independent  PATIENT GOALS   To improve the use of right hand.   OBJECTIVE:     SHOULDER SPECIAL TESTS: Not performed. Patient has pain with all active movements and MRI already performed   PALPATION: tight upper trap Trigger point in sub scap and infraspinatus area  No TTP in bicpes groove     TODAY'S TREATMENT:  3/10 Piriformis stretch 3x20 sec hold  LTR x20  Hamstring stretch 3x20 sec hold   Supine Shoulder Flexion Extension AAROM with Dowel 2x10 2lbs  Supine ABC 2lbs   Standing chop for golf swing red 2x10  Pallof press 2x10 red   Shoulder extension 2x15 green  Rwo 2x15 green   05/10/21:   Supine Shoulder Flexion Extension AAROM with Dowel   Supine Shoulder diagonals AAROM with dowel x 10   Supine stargazer stretch x 30 seconds    Sidelying RUE ER with 1# x 12   Seated bilat ER with red band x 10 reps   Standing shoulder ext with red band x 10 x 2 sets   Standing bilat row with red band x 10 x 2 sets   Standing finger wall  walking x 5 reps, 2 sets, on cabinets.    Standing Lt shoulder flexion to 60 with 1# x 10   Shoulder ext with cane behind back AAROM x 10    Shoulder IR with cane behind back AAROM x 10   Supine bilat ER with yellow band x 10 reps   Supine snow angels with bilat arms for pec stretch x 8 reps   Eval Exercises: Supine Shoulder Flexion Extension AAROM with Dowel - 1 x daily - 7 x weekly - 3 sets - 10 reps Shoulder extension with resistance - Neutral - 1 x daily - 7 x weekly - 3 sets - 10 reps Scapular Retraction with Resistance - 1 x daily - 7 x weekly - 3 sets - 10 reps Standing Shoulder Internal Rotation with Anchored Resistance - 1 x daily - 7 x weekly - 3 sets - 10 reps   PATIENT EDUCATION: Education details: reviewed HEP and symptom management; importance of posture and scapular strengthening Person educated: Patient Education method: Explanation, Demonstration, Tactile cues, Verbal cues, Education comprehension: verbalized understanding, returned demonstration, verbal cues required, tactile cues required, and needs further education   HOME EXERCISE PROGRAM: Access Code: [email or web address] URL: [email or web address] Date: 05/05/2021 Prepared by: Lorayne Benderavid Nailyn Dearinger  Exercises Supine Shoulder Flexion Extension AAROM with Dowel 3 sets - 10 reps Shoulder extension with resistance - Neutral  3 sets - 10 reps Scapular Retraction with Resistance  3 sets - 10 reps Standing Shoulder Internal Rotation with Anchored Resistance 3 sets - 10 reps   ASSESSMENT:  CLINICAL IMPRESSION: The patient is having some pain in his back. He was given rotational stretching. If he is not able to rotate his hips he is going to over rotate at the shoulder with his golf swing. He was given  three stretches to do before he plays. We also reviewed golf specific strengthening. He did well. He had no significant increase in pain. Overall we will continue to progress his strength and motion as tolerated. If the  weather permits he may golf again this week.    OBJECTIVE IMPAIRMENTS decreased activity tolerance, decreased ROM, decreased strength, increased fascial restrictions, impaired UE functional use, and pain.   ACTIVITY LIMITATIONS cleaning, meal prep, occupation, yard work, and shopping.   PERSONAL FACTORS 1-2 comorbidities: spinal stenosis and depression   are also affecting patient's functional outcome.    REHAB POTENTIAL: Excellent  CLINICAL DECISION MAKING: Stable/uncomplicated improving pain over the past few weeks, just limitations in fucntion   EVALUATION COMPLEXITY: Low   GOALS: Goals reviewed with patient? Yes  SHORT TERM GOALS:  STG Name Target Date Goal status 3/10  1 Patient will increase shoulder flexion strength past 90 degrees to 4+/5 Baseline:  06/16/2021 Not tested  Ongoing   2 Patient will increase active left shoulder flexion to 120 degrees  Baseline:  06/16/2021 Progressing above 90 without pain   3 Patient will be independent with basic HEP  Baseline: 06/16/2021 Working on given exercises Achieved   LONG TERM GOALS:   LTG Name Target Date Goal status  1 Patient will increase gross left shoulder strength to 4+/5 in order to use his right arm for ADL's  Baseline: 07/07/2021 INITIAL  2 Patient will return to golf  Baseline: 07/07/2021 INITIAL  3 Patient will reach behind his head without pain  Baseline: 07/07/2021 INITIAL  PLAN: PT FREQUENCY: 1-2x/week  PT DURATION: 6 weeks  PLANNED INTERVENTIONS: Therapeutic exercises, Therapeutic activity, Neuro Muscular re-education, Patient/Family education, Joint mobilization, Electrical stimulation, Cryotherapy, Moist heat, Ultrasound, and Manual therapy  PLAN FOR NEXT SESSION: assess tolerance to HEP; if he tolerated well add in side lying ER, supine ABC( likely no weight) , rhythmic stabilization for ER and flexion if tolerated. advance extension and scap retraction. Work on trigger point release to upper trap and  posterior RTC area if needed. Progress from gravity eliminated as tolerated but may take a few visits. He has small RTC tears but has moderate to severe tendinosis in several muscles. Continue to educate the patient on symptom management when doing his exercises. Progress back to golf specific movements as tolerated.   Lorayne Bender PT DPT  05/26/21 3:42 PM

## 2021-05-26 NOTE — Progress Notes (Signed)
? ?                            ? ? ?Chief Complaint: Right shoulder pain ?  ? ? ?History of Present Illness:  ? ?05/26/2021: Kevin Bartlett presents today for follow-up for his right shoulder.  He has been working with physical therapy and does feel like he is improving.  He was able to recently golf an entire round with little to no pain. ? ?Kevin Bartlett is a 71 y.o. male right-hand-dominant male presents with right shoulder pain approximately 1 week with inability to lift arm.  He states that he was needing to change lug knots on his tire and subsequently noticed limited range of motion about the right arm.  He states he did have a rotator cuff repair done 15 years prior which was doing well until a week prior.  He states that he is having throbbing pain with laying directly on the side.  He is no longer able to actively abduct the arm.  He does smoke a cigar daily. ? ? ? ?Surgical History:   ?None ? ?PMH/PSH/Family History/Social History/Meds/Allergies:   ? ?Past Medical History:  ?Diagnosis Date  ? Arthritis   ? back, knees  ? Asthma   ? Depression   ? Erectile dysfunction `  ? HTN (hypertension)   ? OSA (obstructive sleep apnea)   ? uses CPAP most of time  ? Spinal stenosis   ? ?Past Surgical History:  ?Procedure Laterality Date  ? APPENDECTOMY    ? CARPAL TUNNEL RELEASE Left 03/22/2016  ? Procedure: LEFT CARPAL TUNNEL RELEASE FASCIECTOMY LEFT RING FINGER;  Surgeon: Cindee SaltGary Kuzma, MD;  Location: [facility];  Service: Orthopedics;  Laterality: Left;  AXILLARY BLOCK  ? CARPAL TUNNEL RELEASE Right 05/15/2016  ? Procedure: RIGHT CARPAL TUNNEL RELEASE;  Surgeon: Cindee SaltGary Kuzma, MD;  Location: [facility];  Service: Orthopedics;  Laterality: Right;  ? FASCIECTOMY Left 03/22/2016  ? Procedure: FASCIECTOMY LEFT RING FINGER;  Surgeon: Cindee SaltGary Kuzma, MD;  [facility];  Service: Orthopedics;  Laterality: Left;  ? INGUINAL HERNIA REPAIR Bilateral   ? ?Social History  ? ?Socioeconomic History   ? Marital status: Single  ?  Spouse name: Not on file  ? Number of children: Not on file  ? Years of education: Not on file  ? Highest education level: Not on file  ?Occupational History  ? Not on file  ?Tobacco Use  ? Smoking status: Former  ?  Types: Cigars  ?  Quit date: 01/16/2003  ?  Years since quitting: 18.3  ? Smokeless tobacco: Never  ?Substance and Sexual Activity  ? Alcohol use: Yes  ?  Comment: social  ? Drug use: No  ? Sexual activity: Not on file  ?Other Topics Concern  ? Not on file  ?Social History Narrative  ? Not on file  ? ?Social Determinants of Health  ? ?Financial Resource Strain: Not on file  ?Food Insecurity: Not on file  ?Transportation Needs: Not on file  ?Physical Activity: Not on file  ?Stress: Not on file  ?Social Connections: Not on file  ? ?Family History  ?Problem Relation Age of Onset  ? Heart disease Father   ? ?No Known Allergies ?Current Outpatient Medications  ?Medication Sig Dispense Refill  ? amitriptyline (ELAVIL) 75 MG tablet Take by mouth at bedtime.    ? aspirin 81 MG tablet Take 81 mg  by mouth daily.    ? cholecalciferol (VITAMIN D) 1000 units tablet Take 1,000 Units by mouth daily.    ? diltiazem (TIAZAC) 180 MG 24 hr capsule Take 180 mg by mouth daily.    ? HYDROcodone-acetaminophen (NORCO) 5-325 MG tablet Take 1 tablet by mouth every 6 (six) hours as needed for moderate pain. 20 tablet 0  ? HYDROcodone-acetaminophen (NORCO/VICODIN) 5-325 MG per tablet Take 1 tablet by mouth every 6 (six) hours as needed for pain.    ? meloxicam (MOBIC) 7.5 MG tablet Take 7.5 mg by mouth daily.    ? quinapril (ACCUPRIL) 10 MG tablet Take 10 mg by mouth at bedtime.    ? UNABLE TO FIND PK stimulant    ? vitamin B-12 (CYANOCOBALAMIN) 100 MCG tablet Take 100 mcg by mouth daily.    ? ?No current facility-administered medications for this visit.  ? ?No results found. ? ?Review of Systems:   ?A ROS was performed including pertinent positives and negatives as documented in the HPI. ? ?Physical  Exam :   ?Constitutional: NAD and appears stated age ?Neurological: Alert and oriented ?Psych: Appropriate affect and cooperative ?There were no vitals taken for this visit.  ? ?Comprehensive Musculoskeletal Exam:   ? ?Musculoskeletal Exam    ?Inspection Right Left  ?Skin No atrophy or winging No atrophy or winging  ?Palpation    ?Tenderness Glenohumeral lateral deltoid None  ?Range of Motion    ?Flexion (passive) 150 170  ?Flexion (active) 150 170  ?Abduction 150 170  ?ER at the side 50 70  ?Can reach behind back to Back pocket T12  ?Strength    ? Limited with positive belly press Full  ?Special Tests    ?Pseudoparalytic No No  ?Neurologic    ?Fires PIN, radial, median, ulnar, musculocutaneous, axillary, suprascapular, long thoracic, and spinal accessory innervated muscles. No abnormal sensibility  ?Vascular/Lymphatic    ?Radial Pulse 2+ 2+  ?Cervical Exam    ?Patient has symmetric cervical range of motion with negative Spurling's test.  ?Special Test:   ? ? ? ?Imaging:   ?Xray (3 views right shoulder): ?There are calcifications involving the rotator cuff musculature with mild glenohumeral osteoarthritis ? ?MRI right shoulder: ?There is partial thickness tear involving his previous rotator cuff repair of the supraspinatus.  The tendon is thinned in this area.  Mild glenohumeral osteoarthritis ? ?I personally reviewed and interpreted the radiographs. ? ? ?Assessment:   ?71 year old male with right shoulder weakness and pain after working on his car.  At this time he continues to make improvements with physical therapy.  I did discuss that overall I am quite pleased with how this is therapy is progressing.  At this time he does not have any pain.  Given the fact that he does have a very thin appearing rotator cuff repair, we discussed that the treatment options should he ultimately wish to pursue would be reverse shoulder arthroplasty.  We discussed the complications associated with this.  At this time he continues  to improve with physical therapy and his pain is gotten much better so I do not believe a candidate for shoulder arthroplasty.  At this time he will follow-up as needed should he choose to discuss this versus injection ? ?Plan :   ? ?-Return to clinic as needed ? ? ? ?I personally saw and evaluated the patient, and participated in the management and treatment plan. ? ?Huel Cote, MD ?Attending Physician, Orthopedic Surgery ? ?This document was dictated using Dragon  voice recognition software. A reasonable attempt at proof reading has been made to minimize errors. ?

## 2021-05-31 ENCOUNTER — Ambulatory Visit (HOSPITAL_BASED_OUTPATIENT_CLINIC_OR_DEPARTMENT_OTHER): Payer: Medicare HMO | Admitting: Physical Therapy

## 2021-05-31 ENCOUNTER — Encounter (HOSPITAL_BASED_OUTPATIENT_CLINIC_OR_DEPARTMENT_OTHER): Payer: Self-pay | Admitting: Physical Therapy

## 2021-05-31 ENCOUNTER — Other Ambulatory Visit: Payer: Self-pay

## 2021-05-31 DIAGNOSIS — M25611 Stiffness of right shoulder, not elsewhere classified: Secondary | ICD-10-CM | POA: Diagnosis not present

## 2021-05-31 DIAGNOSIS — G8929 Other chronic pain: Secondary | ICD-10-CM

## 2021-05-31 DIAGNOSIS — M6281 Muscle weakness (generalized): Secondary | ICD-10-CM | POA: Diagnosis not present

## 2021-05-31 DIAGNOSIS — M25511 Pain in right shoulder: Secondary | ICD-10-CM | POA: Diagnosis not present

## 2021-05-31 NOTE — Therapy (Signed)
?OUTPATIENT PHYSICAL THERAPY SHOULDER TREATMENT ? ? ?Patient Name: Kevin Bartlett ?MRN: [id number] ?DOB:[birth year], 71 y.o., male ?Today's Date: 05/31/2021 ? ? PT End of Session - 05/31/21 1106   ? ? Visit Number 4   ? Number of Visits 12   ? Date for PT Re-Evaluation 06/16/21   ? PT Start Time 1103   ? PT Stop Time 1145   ? PT Time Calculation (min) 42 min   ? Activity Tolerance Patient tolerated treatment well   ? Behavior During Therapy [facility] for tasks assessed/performed   ? ?  ?  ? ?  ? ? ? ? ?Past Medical History:  ?Diagnosis Date  ? Arthritis   ? back, knees  ? Asthma   ? Depression   ? Erectile dysfunction `  ? HTN (hypertension)   ? OSA (obstructive sleep apnea)   ? uses CPAP most of time  ? Spinal stenosis   ? ?Past Surgical History:  ?Procedure Laterality Date  ? APPENDECTOMY    ? CARPAL TUNNEL RELEASE Left 03/22/2016  ? Procedure: LEFT CARPAL TUNNEL RELEASE FASCIECTOMY LEFT RING FINGER;  Surgeon: Daryll Brod, MD;  Location: [place];  Service: Orthopedics;  Laterality: Left;  AXILLARY BLOCK  ? CARPAL TUNNEL RELEASE Right 05/15/2016  ? Procedure: RIGHT CARPAL TUNNEL RELEASE;  Surgeon: Daryll Brod, MD;  Location: [place];  Service: Orthopedics;  Laterality: Right;  ? FASCIECTOMY Left 03/22/2016  ? Procedure: FASCIECTOMY LEFT RING FINGER;  Surgeon: Daryll Brod, MD;  Location: [place];  Service: Orthopedics;  Laterality: Left;  ? INGUINAL HERNIA REPAIR Bilateral   ? ?Patient Active Problem List  ? Diagnosis Date Noted  ? Obesity (BMI 30-39.9)   ? HTN (hypertension)   ? Erectile dysfunction   ? OSA (obstructive sleep apnea)   ? Guaiac positive stools   ? Asthma   ? Depression   ? Spinal stenosis   ? ? ?PCP: Gaynelle Arabian, MD ? ?REFERRING PROVIDER: Gaynelle Arabian, MD ? ?REFERRING DIAG: right shoulder pain  ? ?THERAPY DIAG:  ?Chronic right shoulder pain ? ?Stiffness of right shoulder, not elsewhere classified ? ?Muscle weakness (generalized) ? ? ?ONSET DATE: Late  December  ? ?SUBJECTIVE:                                                                                                                                                                                     ? ?SUBJECTIVE STATEMENT: ?Patient played golf the other day. He reported minor shoulder pain. He reports more pain in the back then the shoulder. He wasn't able to hit it as far.  ? ? ?PERTINENT HISTORY: ?Spinal stenosis;  carpal tunnel release on both sides. Depression .  ? ?PAIN:  ?Are you having pain? no ?NPRS scale: 0/10 ?Pain location: right shoulder  ?Pain orientation: Right and Posterior  ?PAIN TYPE:  ?Pain description: intermittent  ?Aggravating factors: use of the shoulder  ?Relieving factors: ibuprofen  ? ?PRECAUTIONS: None ( smokes Cigars)  ? ?WEIGHT BEARING RESTRICTIONS No ? ?FALLS:  ?Has patient fallen in last 6 months? No Number of falls:  ? ?LIVING ENVIRONMENT: ?Nothing pertinent  ?OCCUPATION: ?Retired  ? ?Hobbies:  ?Plays golf  ? ? ?PLOF: Independent ? ?PATIENT GOALS  ? ?To improve the use of right hand.  ? ?OBJECTIVE:  ? ? ? ?SHOULDER SPECIAL TESTS: ?Not performed. Patient has pain with all active movements and MRI already performed  ? ?PALPATION: tight upper trap ?Trigger point in sub scap and infraspinatus area  ?No TTP in bicpes groove  ? ?  ?TODAY'S TREATMENT:  ?3/15 ? ?Supine Shoulder Flexion Extension AAROM with Dowel x20 2lbs  ?Supine ABC 2lbs  ? ?SLE ER 2lb 2x10  ? ?Towel flexion on wall x20  ?Cable row x20 10 lbs  ?Cable extension x20 10 lbs  ? ? ?                       Standing chop for golf swing 10 lbs bilateral cable machine  ?Pallof press 10 lbs x10 bilateral  ? ?PROM into end range flexion and ER  ? ? ?3/10 ?Piriformis stretch 3x20 sec hold  ?LTR x20  ?Hamstring stretch 3x20 sec hold  ? ?Supine Shoulder Flexion Extension AAROM with Dowel 2x10 2lbs  ?Supine ABC 2lbs  ? ?Standing chop for golf swing red 2x10  ?Pallof press 2x10 red  ? ?Shoulder extension 2x15 green  ?Rwo 2x15 green   ? ?05/10/21: ?  Supine Shoulder Flexion Extension AAROM with Dowel ?  Supine Shoulder diagonals AAROM with dowel x 10 ?  Supine stargazer stretch x 30 seconds  ?  Sidelying RUE ER with 1# x 12 ?  Seated bilat ER with red band x 10 reps ?  Standing shoulder ext with red band x 10 x 2 sets ?  Standing bilat row with red band x 10 x 2 sets ?  Standing finger wall walking x 5 reps, 2 sets, on cabinets.  ?  Standing Lt shoulder flexion to 60? with 1# x 10 ?  Shoulder ext with cane behind back AAROM x 10  ?  Shoulder IR with cane behind back AAROM x 10 ?  Supine bilat ER with yellow band x 10 reps ?  Supine snow angels with bilat arms for pec stretch x 8 reps ? ? ?Eval Exercises: ?Supine Shoulder Flexion Extension AAROM with Dowel - 1 x daily - 7 x weekly - 3 sets - 10 reps ?Shoulder extension with resistance - Neutral - 1 x daily - 7 x weekly - 3 sets - 10 reps ?Scapular Retraction with Resistance - 1 x daily - 7 x weekly - 3 sets - 10 reps ?Standing Shoulder Internal Rotation with Anchored Resistance - 1 x daily - 7 x weekly - 3 sets - 10 reps ? ? ?PATIENT EDUCATION: ?Education details: reviewed HEP and symptom management; importance of posture and scapular strengthening ?Person educated: Patient ?Education method: Explanation, Demonstration, Tactile cues, Verbal cues, ?Education comprehension: verbalized understanding, returned demonstration, verbal cues required, tactile cues required, and needs further education ? ? ?HOME EXERCISE PROGRAM: ?Access Code: [email or web address] ?URL: [email or web address] ?Date: 05/05/2021 ?Prepared by:  Carolyne Littles ? ?Exercises ?Supine Shoulder Flexion Extension AAROM with Dowel 3 sets - 10 reps ?Shoulder extension with resistance - Neutral  3 sets - 10 reps ?Scapular Retraction with Resistance  3 sets - 10 reps ?Standing Shoulder Internal Rotation with Anchored Resistance 3 sets - 10 reps ? ? ?ASSESSMENT: ? ?CLINICAL IMPRESSION: ?The patients back pain has resolved. He is making  good progress. He had no significant pain with treatment. We advanced him to weights today. He reported some fatigue but no pain. He did feel some tightness with wall slide overhead. Therapy will continue to progress as tolerated. We advanced his supine ABC today as well as his weight with side lying ER.   ? ?OBJECTIVE IMPAIRMENTS decreased activity tolerance, decreased ROM, decreased strength, increased fascial restrictions, impaired UE functional use, and pain.  ? ?ACTIVITY LIMITATIONS cleaning, meal prep, occupation, yard work, and shopping.  ? ?PERSONAL FACTORS 1-2 comorbidities: spinal stenosis and depression   are also affecting patient's functional outcome.  ? ? ?REHAB POTENTIAL: Excellent ? ?CLINICAL DECISION MAKING: Stable/uncomplicated improving pain over the past few weeks, just limitations in fucntion  ? ?EVALUATION COMPLEXITY: Low ? ? ?GOALS: ?Goals reviewed with patient? Yes ? ?SHORT TERM GOALS: ? ?STG Name Target Date Goal status ?3/10  ?1 Patient will increase shoulder flexion strength past 90 degrees to 4+/5 ?Baseline:  06/21/2021 Not tested  ?Ongoing   ?2 Patient will increase active left shoulder flexion to 120 degrees  ?Baseline:  06/21/2021 Progressing above 90 without pain   ?3 Patient will be independent with basic HEP  ?Baseline: 06/21/2021 Working on given exercises Achieved   ?LONG TERM GOALS:  ? ?LTG Name Target Date Goal status  ?1 Patient will increase gross left shoulder strength to 4+/5 in order to use his right arm for ADL's  ?Baseline: 07/12/2021 INITIAL  ?2 Patient will return to golf  ?Baseline: 07/12/2021 INITIAL  ?3 Patient will reach behind his head without pain  ?Baseline: 07/12/2021 INITIAL  ?PLAN: ?PT FREQUENCY: 1-2x/week ? ?PT DURATION: 6 weeks ? ?PLANNED INTERVENTIONS: Therapeutic exercises, Therapeutic activity, Neuro Muscular re-education, Patient/Family education, Joint mobilization, Electrical stimulation, Cryotherapy, Moist heat, Ultrasound, and Manual therapy ? ?PLAN FOR NEXT  SESSION: assess tolerance to HEP; if he tolerated well add in side lying ER, supine ABC( likely no weight) , rhythmic stabilization for ER and flexion if tolerated. advance extension and scap retraction. Work on

## 2021-06-08 ENCOUNTER — Ambulatory Visit (HOSPITAL_BASED_OUTPATIENT_CLINIC_OR_DEPARTMENT_OTHER): Payer: Medicare HMO | Admitting: Physical Therapy

## 2021-06-08 ENCOUNTER — Other Ambulatory Visit: Payer: Self-pay

## 2021-06-08 ENCOUNTER — Encounter (HOSPITAL_BASED_OUTPATIENT_CLINIC_OR_DEPARTMENT_OTHER): Payer: Self-pay | Admitting: Physical Therapy

## 2021-06-08 DIAGNOSIS — M25511 Pain in right shoulder: Secondary | ICD-10-CM | POA: Diagnosis not present

## 2021-06-08 DIAGNOSIS — M6281 Muscle weakness (generalized): Secondary | ICD-10-CM

## 2021-06-08 DIAGNOSIS — G8929 Other chronic pain: Secondary | ICD-10-CM

## 2021-06-08 DIAGNOSIS — M25611 Stiffness of right shoulder, not elsewhere classified: Secondary | ICD-10-CM

## 2021-06-08 NOTE — Therapy (Signed)
?OUTPATIENT PHYSICAL THERAPY SHOULDER TREATMENT ? ? ?Patient Name: Kevin Bartlett ?MRN: [id number] ?DOB:[birth year], 71 y.o., male ?Today's Date: 06/09/2021 ? ? PT End of Session - 06/08/21 1104   ? ? Visit Number 5   ? Number of Visits 12   ? Date for PT Re-Evaluation 06/16/21   ? PT Start Time 1100   ? PT Stop Time [id number]   ? PT Time Calculation (min) 43 min   ? Activity Tolerance Patient tolerated treatment well   ? Behavior During Therapy [facility] for tasks assessed/performed   ? ?  ?  ? ?  ? ? ? ? ?Past Medical History:  ?Diagnosis Date  ? Arthritis   ? back, knees  ? Asthma   ? Depression   ? Erectile dysfunction `  ? HTN (hypertension)   ? OSA (obstructive sleep apnea)   ? uses CPAP most of time  ? Spinal stenosis   ? ?Past Surgical History:  ?Procedure Laterality Date  ? APPENDECTOMY    ? CARPAL TUNNEL RELEASE Left 03/22/2016  ? Procedure: LEFT CARPAL TUNNEL RELEASE FASCIECTOMY LEFT RING FINGER;  Surgeon: Daryll Brod, MD;  Location: [place];  Service: Orthopedics;  Laterality: Left;  AXILLARY BLOCK  ? CARPAL TUNNEL RELEASE Right 05/15/2016  ? Procedure: RIGHT CARPAL TUNNEL RELEASE;  Surgeon: Daryll Brod, MD;  Location: [place];  Service: Orthopedics;  Laterality: Right;  ? FASCIECTOMY Left 03/22/2016  ? Procedure: FASCIECTOMY LEFT RING FINGER;  Surgeon: Daryll Brod, MD;  Location: [place];  Service: Orthopedics;  Laterality: Left;  ? INGUINAL HERNIA REPAIR Bilateral   ? ?Patient Active Problem List  ? Diagnosis Date Noted  ? Obesity (BMI 30-39.9)   ? HTN (hypertension)   ? Erectile dysfunction   ? OSA (obstructive sleep apnea)   ? Guaiac positive stools   ? Asthma   ? Depression   ? Spinal stenosis   ? ? ?PCP: Gaynelle Arabian, MD ? ?REFERRING PROVIDER: Gaynelle Arabian, MD ? ?REFERRING DIAG: right shoulder pain  ? ?THERAPY DIAG:  ?Chronic right shoulder pain ? ?Stiffness of right shoulder, not elsewhere classified ? ?Muscle weakness (generalized) ? ? ?ONSET DATE: Late  December  ? ?SUBJECTIVE:                                                                                                                                                                                     ? ?SUBJECTIVE STATEMENT: ?Patient played golf the other day. He reported minor shoulder pain. He reports more pain in the back then the shoulder. He wasn't able to hit it as far.  ? ? ?PERTINENT HISTORY: ?Spinal stenosis;  carpal tunnel release on both sides. Depression .  ? ?PAIN:  ?Are you having pain? no ?NPRS scale: 0/10 ?Pain location: right shoulder  ?Pain orientation: Right and Posterior  ?PAIN TYPE:  ?Pain description: intermittent  ?Aggravating factors: use of the shoulder  ?Relieving factors: ibuprofen  ? ?PRECAUTIONS: None ( smokes Cigars)  ? ?WEIGHT BEARING RESTRICTIONS No ? ?FALLS:  ?Has patient fallen in last 6 months? No Number of falls:  ? ?LIVING ENVIRONMENT: ?Nothing pertinent  ?OCCUPATION: ?Retired  ? ?Hobbies:  ?Plays golf  ? ? ?PLOF: Independent ? ?PATIENT GOALS  ? ?To improve the use of right hand.  ? ?OBJECTIVE:  ? ? ? ?SHOULDER SPECIAL TESTS: ?Not performed. Patient has pain with all active movements and MRI already performed  ? ?PALPATION: tight upper trap ?Trigger point in sub scap and infraspinatus area  ?No TTP in bicpes groove  ? ?  ?TODAY'S TREATMENT:  ?3/23 ?Supine Shoulder Flexion Extension AAROM with Dowel x20 2lbs  ?Supine ABC 3lbs  ? ?SLE ER 2lb 2x10  ? ?Band row x20 blue  ?Band extension x20 blue  ? ?Wall flexion 2x10 with towel  ? ?Band ER red x20  ?Band IR x20  ? ? ? ? ?3/15 ? ?Supine Shoulder Flexion Extension AAROM with Dowel x20 2lbs  ?Supine ABC 2lbs  ? ?SLE ER 2lb 2x10  ? ?Towel flexion on wall x20  ?Cable row x20 10 lbs  ?Cable extension x20 10 lbs  ? ? ?                       Standing chop for golf swing 10 lbs bilateral cable machine  ?Pallof press 10 lbs x10 bilateral  ? ?PROM into end range flexion and ER  ? ? ?3/10 ?Piriformis stretch 3x20 sec hold  ?LTR x20   ?Hamstring stretch 3x20 sec hold  ? ?Supine Shoulder Flexion Extension AAROM with Dowel 2x10 2lbs  ?Supine ABC 2lbs  ? ?Standing chop for golf swing red 2x10  ?Pallof press 2x10 red  ? ?Shoulder extension 2x15 green  ?Rwo 2x15 green  ? ?05/10/21: ?  Supine Shoulder Flexion Extension AAROM with Dowel ?  Supine Shoulder diagonals AAROM with dowel x 10 ?  Supine stargazer stretch x 30 seconds  ?  Sidelying RUE ER with 1# x 12 ?  Seated bilat ER with red band x 10 reps ?  Standing shoulder ext with red band x 10 x 2 sets ?  Standing bilat row with red band x 10 x 2 sets ?  Standing finger wall walking x 5 reps, 2 sets, on cabinets.  ?  Standing Lt shoulder flexion to 60? with 1# x 10 ?  Shoulder ext with cane behind back AAROM x 10  ?  Shoulder IR with cane behind back AAROM x 10 ?  Supine bilat ER with yellow band x 10 reps ?  Supine snow angels with bilat arms for pec stretch x 8 reps ? ? ?Eval Exercises: ?Supine Shoulder Flexion Extension AAROM with Dowel - 1 x daily - 7 x weekly - 3 sets - 10 reps ?Shoulder extension with resistance - Neutral - 1 x daily - 7 x weekly - 3 sets - 10 reps ?Scapular Retraction with Resistance - 1 x daily - 7 x weekly - 3 sets - 10 reps ?Standing Shoulder Internal Rotation with Anchored Resistance - 1 x daily - 7 x weekly - 3 sets - 10 reps ? ? ?PATIENT EDUCATION: ?Education  details: reviewed HEP and symptom management; importance of posture and scapular strengthening ?Person educated: Patient ?Education method: Explanation, Demonstration, Tactile cues, Verbal cues, ?Education comprehension: verbalized understanding, returned demonstration, verbal cues required, tactile cues required, and needs further education ? ? ?HOME EXERCISE PROGRAM: ?Access Code: [email or web address] ?URL: [email or web address] ?Date: 05/05/2021 ?Prepared by: Carolyne Littles ? ?Exercises ?Supine Shoulder Flexion Extension AAROM with Dowel 3 sets - 10 reps ?Shoulder extension with resistance - Neutral  3 sets - 10  reps ?Scapular Retraction with Resistance  3 sets - 10 reps ?Standing Shoulder Internal Rotation with Anchored Resistance 3 sets - 10 reps ? ? ?ASSESSMENT: ? ?CLINICAL IMPRESSION: ?The patient continues to make progress. His range was full today with manual therapy. He tolerated exercises well. We advanced his bands and weight with supine exercises. He will play golf and see how he feels. He will have one more visit then likely follow up in a few weeks. He continues to have low grade pain with movement above 90 degrees.  ?OBJECTIVE IMPAIRMENTS decreased activity tolerance, decreased ROM, decreased strength, increased fascial restrictions, impaired UE functional use, and pain.  ? ?ACTIVITY LIMITATIONS cleaning, meal prep, occupation, yard work, and shopping.  ? ?PERSONAL FACTORS 1-2 comorbidities: spinal stenosis and depression   are also affecting patient's functional outcome.  ? ? ?REHAB POTENTIAL: Excellent ? ?CLINICAL DECISION MAKING: Stable/uncomplicated improving pain over the past few weeks, just limitations in fucntion  ? ?EVALUATION COMPLEXITY: Low ? ? ?GOALS: ?Goals reviewed with patient? Yes ? ?SHORT TERM GOALS: ? ?STG Name Target Date Goal status ?3/10  ?1 Patient will increase shoulder flexion strength past 90 degrees to 4+/5 ?Baseline:  06/30/2021 Not tested  ?Ongoing   ?2 Patient will increase active left shoulder flexion to 120 degrees  ?Baseline:  06/30/2021 Progressing above 90 without pain   ?3 Patient will be independent with basic HEP  ?Baseline: 06/30/2021 Working on given exercises Achieved   ?LONG TERM GOALS:  ? ?LTG Name Target Date Goal status  ?1 Patient will increase gross left shoulder strength to 4+/5 in order to use his right arm for ADL's  ?Baseline: 07/21/2021 INITIAL  ?2 Patient will return to golf  ?Baseline: 07/21/2021 INITIAL  ?3 Patient will reach behind his head without pain  ?Baseline: 07/21/2021 INITIAL  ?PLAN: ?PT FREQUENCY: 1-2x/week ? ?PT DURATION: 6 weeks ? ?PLANNED INTERVENTIONS:  Therapeutic exercises, Therapeutic activity, Neuro Muscular re-education, Patient/Family education, Joint mobilization, Electrical stimulation, Cryotherapy, Moist heat, Ultrasound, and Manual therapy ? ?PLAN FOR N

## 2021-06-09 ENCOUNTER — Encounter (HOSPITAL_BASED_OUTPATIENT_CLINIC_OR_DEPARTMENT_OTHER): Payer: Self-pay | Admitting: Physical Therapy

## 2021-06-14 ENCOUNTER — Encounter (HOSPITAL_BASED_OUTPATIENT_CLINIC_OR_DEPARTMENT_OTHER): Payer: Self-pay | Admitting: Physical Therapy

## 2021-06-14 ENCOUNTER — Ambulatory Visit (HOSPITAL_BASED_OUTPATIENT_CLINIC_OR_DEPARTMENT_OTHER): Payer: Medicare HMO | Admitting: Physical Therapy

## 2021-06-14 ENCOUNTER — Other Ambulatory Visit: Payer: Self-pay

## 2021-06-14 DIAGNOSIS — G8929 Other chronic pain: Secondary | ICD-10-CM

## 2021-06-14 DIAGNOSIS — M25611 Stiffness of right shoulder, not elsewhere classified: Secondary | ICD-10-CM | POA: Diagnosis not present

## 2021-06-14 DIAGNOSIS — M6281 Muscle weakness (generalized): Secondary | ICD-10-CM

## 2021-06-14 DIAGNOSIS — M25511 Pain in right shoulder: Secondary | ICD-10-CM | POA: Diagnosis not present

## 2021-06-14 NOTE — Therapy (Signed)
?OUTPATIENT PHYSICAL THERAPY SHOULDER TREATMENT ? ? ?Patient Name: Kevin Bartlett ?MRN: [id number] ?DOB:01-Sep-1950, 71 y.o., male ?Today's Date: 06/14/2021 ? ? PT End of Session - 06/14/21 1116   ? ? Visit Number 6   ? Number of Visits 12   ? Date for PT Re-Evaluation 06/16/21   ? PT Start Time 1100   ? PT Stop Time 1144   ? PT Time Calculation (min) 44 min   ? Activity Tolerance Patient tolerated treatment well   ? Behavior During Therapy [facility] for tasks assessed/performed   ? ?  ?  ? ?  ? ? ? ? ?Past Medical History:  ?Diagnosis Date  ? Arthritis   ? back, knees  ? Asthma   ? Depression   ? Erectile dysfunction `  ? HTN (hypertension)   ? OSA (obstructive sleep apnea)   ? uses CPAP most of time  ? Spinal stenosis   ? ?Past Surgical History:  ?Procedure Laterality Date  ? APPENDECTOMY    ? CARPAL TUNNEL RELEASE Left 03/22/2016  ? Procedure: LEFT CARPAL TUNNEL RELEASE FASCIECTOMY LEFT RING FINGER;  Surgeon: Cindee Salt, MD;  Location: [facility];  Service: Orthopedics;  Laterality: Left;  AXILLARY BLOCK  ? CARPAL TUNNEL RELEASE Right 05/15/2016  ? Procedure: RIGHT CARPAL TUNNEL RELEASE;  Surgeon: Cindee Salt, MD;  Location: [facility];  Service: Orthopedics;  Laterality: Right;  ? FASCIECTOMY Left 03/22/2016  ? Procedure: FASCIECTOMY LEFT RING FINGER;  Surgeon: Cindee Salt, MD;  Location: [facility];  Service: Orthopedics;  Laterality: Left;  ? INGUINAL HERNIA REPAIR Bilateral   ? ?Patient Active Problem List  ? Diagnosis Date Noted  ? Obesity (BMI 30-39.9)   ? HTN (hypertension)   ? Erectile dysfunction   ? OSA (obstructive sleep apnea)   ? Guaiac positive stools   ? Asthma   ? Depression   ? Spinal stenosis   ? ? ?PCP: Blair Heys, MD ? ?REFERRING PROVIDER: Blair Heys, MD ? ?REFERRING DIAG: right shoulder pain  ? ?THERAPY DIAG:  ?No diagnosis found. ? ? ?ONSET DATE: Late December  ? ?SUBJECTIVE:                                                                                                                                                                                      ? ?SUBJECTIVE STATEMENT: ?The patient reports some tightness in his upper trap today. He played golf without much pain. He hasn't been able to hit the ball as far but he has been able to play.   ? ? ?PERTINENT HISTORY: ?Spinal stenosis; carpal tunnel release on both sides. Depression .  ? ?  PAIN:  ?Are you having pain? no ?NPRS scale: 0/10 ?Pain location: right shoulder  ?Pain orientation: Right and Posterior  ?PAIN TYPE:  ?Pain description: intermittent  ?Aggravating factors: use of the shoulder  ?Relieving factors: ibuprofen  ? ?PRECAUTIONS: None ( smokes Cigars)  ? ?WEIGHT BEARING RESTRICTIONS No ? ?FALLS:  ?Has patient fallen in last 6 months? No Number of falls:  ? ?LIVING ENVIRONMENT: ?Nothing pertinent  ?OCCUPATION: ?Retired  ? ?Hobbies:  ?Plays golf  ? ? ?PLOF: Independent ? ?PATIENT GOALS  ? ?To improve the use of right hand.  ? ?OBJECTIVE:  ? ? ? ?SHOULDER SPECIAL TESTS: ?Not performed. Patient has pain with all active movements and MRI already performed  ? ?PALPATION: tight upper trap ?Trigger point in sub scap and infraspinatus area  ?No TTP in bicpes groove  ? ?  ?TODAY'S TREATMENT:  ?3/29 ?Manual: trigger point releaser to upper trap;  Inferior and posterior glides to right shoudler ? ?Right arm supine flexion 2x10 1lb 2x10  ?Sine lying ER 1lb 2x15  ? ? ? ?3/23 ?Supine Shoulder Flexion Extension AAROM with Dowel x20 2lbs  ?Supine ABC 3lbs  ? ?SLE ER 2lb 2x10  ? ?Band row x20 blue  ?Band extension x20 blue  ? ?Wall flexion 2x10 with towel  ? ?Band ER red x20  ?Band IR x20  ? ? ? ? ?3/15 ? ?Supine Shoulder Flexion Extension AAROM with Dowel x20 2lbs  ?Supine ABC 2lbs  ? ?SLE ER 2lb 2x10  ? ?Towel flexion on wall x20  ?Cable row x20 10 lbs  ?Cable extension x20 10 lbs  ? ? ?                       Standing chop for golf swing 10 lbs bilateral cable machine  ?Pallof press 10 lbs x10 bilateral  ? ?PROM into  end range flexion and ER  ? ? ?3/10 ?Piriformis stretch 3x20 sec hold  ?LTR x20  ?Hamstring stretch 3x20 sec hold  ? ?Supine Shoulder Flexion Extension AAROM with Dowel 2x10 2lbs  ?Supine ABC 2lbs  ? ?Standing chop for golf swing red 2x10  ?Pallof press 2x10 red  ? ?Shoulder extension 2x15 green  ?Rwo 2x15 green  ? ?05/10/21: ?  Supine Shoulder Flexion Extension AAROM with Dowel ?  Supine Shoulder diagonals AAROM with dowel x 10 ?  Supine stargazer stretch x 30 seconds  ?  Sidelying RUE ER with 1# x 12 ?  Seated bilat ER with red band x 10 reps ?  Standing shoulder ext with red band x 10 x 2 sets ?  Standing bilat row with red band x 10 x 2 sets ?  Standing finger wall walking x 5 reps, 2 sets, on cabinets.  ?  Standing Lt shoulder flexion to 60? with 1# x 10 ?  Shoulder ext with cane behind back AAROM x 10  ?  Shoulder IR with cane behind back AAROM x 10 ?  Supine bilat ER with yellow band x 10 reps ?  Supine snow angels with bilat arms for pec stretch x 8 reps ? ? ?Eval Exercises: ?Supine Shoulder Flexion Extension AAROM with Dowel - 1 x daily - 7 x weekly - 3 sets - 10 reps ?Shoulder extension with resistance - Neutral - 1 x daily - 7 x weekly - 3 sets - 10 reps ?Scapular Retraction with Resistance - 1 x daily - 7 x weekly - 3 sets - 10 reps ?Standing Shoulder  Internal Rotation with Anchored Resistance - 1 x daily - 7 x weekly - 3 sets - 10 reps ? ? ?PATIENT EDUCATION: ?Education details: reviewed HEP and symptom management; importance of posture and scapular strengthening ?Person educated: Patient ?Education method: Explanation, Demonstration, Tactile cues, Verbal cues, ?Education comprehension: verbalized understanding, returned demonstration, verbal cues required, tactile cues required, and needs further education ? ? ?HOME EXERCISE PROGRAM: ?Access Code: [email or web address] ?URL: [email or web address] ?Date: 05/05/2021 ?Prepared by: Lorayne Benderavid Anjalee Cope ? ?Exercises ?Supine Shoulder Flexion Extension AAROM with  Dowel 3 sets - 10 reps ?Shoulder extension with resistance - Neutral  3 sets - 10 reps ?Scapular Retraction with Resistance  3 sets - 10 reps ?Standing Shoulder Internal Rotation with Anchored Resistance 3 sets - 10 reps ? ? ?ASSESSMENT: ? ?CLINICAL IMPRESSION: ?Therapy worked on the trigger point in his upper trap. He is likely compensating when reaching overhead. He was advised he can put some pressure up there on his own. We added in standing flexion and scaption today. When he comes around 3990 he does hike his shoulder. We talked about it in the mirror. He will proactive this at home about 3x a week We also worked on golf specific exercises. He has been able to play but he is having toruble with power.  ? ? ?OBJECTIVE IMPAIRMENTS decreased activity tolerance, decreased ROM, decreased strength, increased fascial restrictions, impaired UE functional use, and pain.  ? ?ACTIVITY LIMITATIONS cleaning, meal prep, occupation, yard work, and shopping.  ? ?PERSONAL FACTORS 1-2 comorbidities: spinal stenosis and depression   are also affecting patient's functional outcome.  ? ? ?REHAB POTENTIAL: Excellent ? ?CLINICAL DECISION MAKING: Stable/uncomplicated improving pain over the past few weeks, just limitations in fucntion  ? ?EVALUATION COMPLEXITY: Low ? ? ?GOALS: ?Goals reviewed with patient? Yes ? ?SHORT TERM GOALS: ? ?STG Name Target Date Goal status ?3/10  ?1 Patient will increase shoulder flexion strength past 90 degrees to 4+/5 ?Baseline:  07/05/2021 Not tested  ?Ongoing   ?2 Patient will increase active left shoulder flexion to 120 degrees  ?Baseline:  07/05/2021 Progressing above 90 without pain   ?3 Patient will be independent with basic HEP  ?Baseline: 07/05/2021 Working on given exercises Achieved   ?LONG TERM GOALS:  ? ?LTG Name Target Date Goal status  ?1 Patient will increase gross left shoulder strength to 4+/5 in order to use his right arm for ADL's  ?Baseline: 07/26/2021 INITIAL  ?2 Patient will return to golf   ?Baseline: 07/26/2021 INITIAL  ?3 Patient will reach behind his head without pain  ?Baseline: 07/26/2021 INITIAL  ?PLAN: ?PT FREQUENCY: 1-2x/week ? ?PT DURATION: 6 weeks ? ?PLANNED INTERVENTIONS: Therap

## 2021-06-30 ENCOUNTER — Encounter (HOSPITAL_BASED_OUTPATIENT_CLINIC_OR_DEPARTMENT_OTHER): Payer: Self-pay | Admitting: Physical Therapy

## 2021-06-30 ENCOUNTER — Ambulatory Visit (HOSPITAL_BASED_OUTPATIENT_CLINIC_OR_DEPARTMENT_OTHER): Payer: Medicare HMO | Attending: Orthopaedic Surgery | Admitting: Physical Therapy

## 2021-06-30 DIAGNOSIS — M25511 Pain in right shoulder: Secondary | ICD-10-CM | POA: Diagnosis not present

## 2021-06-30 DIAGNOSIS — M6281 Muscle weakness (generalized): Secondary | ICD-10-CM | POA: Insufficient documentation

## 2021-06-30 DIAGNOSIS — G8929 Other chronic pain: Secondary | ICD-10-CM | POA: Insufficient documentation

## 2021-06-30 DIAGNOSIS — M25611 Stiffness of right shoulder, not elsewhere classified: Secondary | ICD-10-CM | POA: Diagnosis not present

## 2021-06-30 NOTE — Addendum Note (Signed)
Addended by: Carney Living on: 06/30/2021 12:47 PM ? ? Modules accepted: Orders ? ?

## 2021-06-30 NOTE — Therapy (Signed)
?OUTPATIENT PHYSICAL THERAPY SHOULDER TREATMENT ? ? ?Patient Name: Kevin MaximRichard F Haskin ?MRN: [id number] ?DOB:04/17/1950, 71 y.o., male ?Today's Date: 06/30/2021 ? ? PT End of Session - 06/30/21 0921   ? ? Visit Number 7   ? Number of Visits 11   ? Date for PT Re-Evaluation 07/28/21   ? PT Start Time 0845   ? PT Stop Time [phone]   ? PT Time Calculation (min) 43 min   ? Activity Tolerance Patient tolerated treatment well   ? Behavior [facility] for tasks assessed/performed   ? ?  ?  ? ?  ? ? ? ? ?Past Medical History:  ?Diagnosis Date  ? Arthritis   ? back, knees  ? Asthma   ? Depression   ? Erectile dysfunction `  ? HTN (hypertension)   ? OSA (obstructive sleep apnea)   ? uses CPAP most of time  ? Spinal stenosis   ? ?Past Surgical History:  ?Procedure Laterality Date  ? APPENDECTOMY    ? CARPAL TUNNEL RELEASE Left 03/22/2016  ? Procedure: LEFT CARPAL TUNNEL RELEASE FASCIECTOMY LEFT RING FINGER;  Surgeon: Cindee SaltGary Kuzma, MD;  Location: [facility];  Service: Orthopedics;  Laterality: Left;  AXILLARY BLOCK  ? CARPAL TUNNEL RELEASE Right 05/15/2016  ? Procedure: RIGHT CARPAL TUNNEL RELEASE;  Surgeon: Cindee SaltGary Kuzma, MD;  Location: [facility];  Service: Orthopedics;  Laterality: Right;  ? FASCIECTOMY Left 03/22/2016  ? Procedure: FASCIECTOMY LEFT RING FINGER;  Surgeon: Cindee SaltGary Kuzma, MD;  Location: [facility];  Service: Orthopedics;  Laterality: Left;  ? INGUINAL HERNIA REPAIR Bilateral   ? ?Patient Active Problem List  ? Diagnosis Date Noted  ? Obesity (BMI 30-39.9)   ? HTN (hypertension)   ? Erectile dysfunction   ? OSA (obstructive sleep apnea)   ? Guaiac positive stools   ? Asthma   ? Depression   ? Spinal stenosis   ? ? ?PCP: Blair HeysEhinger, Robert, MD ? ?REFERRING PROVIDER: Blair HeysEhinger, Robert, MD ? ?REFERRING DIAG: right shoulder pain  ? ?THERAPY DIAG:  ?Chronic right shoulder pain ? ?Stiffness of right shoulder, not elsewhere classified ? ?Muscle weakness (generalized) ? ? ?ONSET DATE: Late  December  ? ?SUBJECTIVE:                                                                                                                                                                                     ? ?SUBJECTIVE STATEMENT: ?The patient reports some tightness in his upper trap today. He played golf without much pain. He hasn't been able to hit the ball as far but he has been able to play.   ? ? ?  PERTINENT HISTORY: ?Spinal stenosis; carpal tunnel release on both sides. Depression .  ? ?PAIN:  ?Are you having pain? no ?NPRS scale: 0/10 ?Pain location: right shoulder  ?Pain orientation: Right and Posterior  ?PAIN TYPE:  ?Pain description: intermittent  ?Aggravating factors: use of the shoulder  ?Relieving factors: ibuprofen  ? ?PRECAUTIONS: None ( smokes Cigars)  ? ?WEIGHT BEARING RESTRICTIONS No ? ?FALLS:  ?Has patient fallen in last 6 months? No Number of falls:  ? ?LIVING ENVIRONMENT: ?Nothing pertinent  ?OCCUPATION: ?Retired  ? ?Hobbies:  ?Plays golf  ? ? ?PLOF: Independent ? ?PATIENT GOALS  ? ?To improve the use of right hand.  ? ?OBJECTIVE:  ?4/14  ?AROM: minor pain with right end range flexion otherwise full  ?MMT ? ?Right shoulder flexion 4/5 over 90 degrees with minor pain  ?Below 90 4+/5 ? ?Mild tenderness to palpation in the anterior shoulder and upper trap ? ? ?  ?TODAY'S TREATMENT:  ?4/14 ?Supine Shoulder Flexion Extension AAROM with Dowel x20 2lbs  ?Supine ABC 3lbs  ? ?SLE ER 2lb 2x10  ? ?Band row x20 blue  ?Band extension x20 blue  ? ? ? ? ?Band IR x20 green  ? ?Shelf reach 2lbs 2x10  ?Finger ladder 2x3 to step 12  ? ? ?3/29 ?Manual: trigger point releaser to upper trap;  Inferior and posterior glides to right shoudler ? ?Right arm supine flexion 2x10 1lb 2x10  ?Sine lying ER 1lb 2x15  ? ? ? ?3/23 ?Supine Shoulder Flexion Extension AAROM with Dowel x20 2lbs  ?Supine ABC 3lbs  ? ?SLE ER 2lb 2x10  ? ?Band row x20 blue  ?Band extension x20 blue  ? ?Wall flexion 2x10 with towel  ? ?Band ER red x20   ?Band IR x20  ? ? ? ? ?3/15 ? ?Supine Shoulder Flexion Extension AAROM with Dowel x20 2lbs  ?Supine ABC 2lbs  ? ?SLE ER 2lb 2x10  ? ?Towel flexion on wall x20  ?Cable row x20 10 lbs  ?Cable extension x20 10 lbs  ? ? ?                       Standing chop for golf swing 10 lbs bilateral cable machine  ?Pallof press 10 lbs x10 bilateral  ? ?PROM into end range flexion and ER  ? ? ? ?PATIENT EDUCATION: ?Education details: reviewed HEP and symptom management; importance of posture and scapular strengthening ?Person educated: Patient ?Education method: Explanation, Demonstration, Tactile cues, Verbal cues, ?Education comprehension: verbalized understanding, returned demonstration, verbal cues required, tactile cues required, and needs further education ? ? ?HOME EXERCISE PROGRAM: ?Access Code: [email or web address] ?URL: [email or web address] ?Date: 05/05/2021 ?Prepared by: Lorayne Bender ? ?Exercises ?Supine Shoulder Flexion Extension AAROM with Dowel 3 sets - 10 reps ?Shoulder extension with resistance - Neutral  3 sets - 10 reps ?Scapular Retraction with Resistance  3 sets - 10 reps ?Standing Shoulder Internal Rotation with Anchored Resistance 3 sets - 10 reps ? ? ?ASSESSMENT: ? ?CLINICAL IMPRESSION: ?The patient is doing well. He has full range of motion with mild crepitus at end range but no pain. He is only having pain in the mornings. He can control it with a hot pack. He is playing golf without much pain. He tolerated there-x well today. We reviewed shelf reach with a weight. He was advised he can mix in and out some of his single arm stability exercises. He still has some weakness reaching overhead but this may be baseline with the  tear. He is not out of visits but its out of his date for certification. We will continue 1W4 to finalize HEP prior to D/C  ? ?OBJECTIVE IMPAIRMENTS decreased activity tolerance, decreased ROM, decreased strength, increased fascial restrictions, impaired UE functional use, and pain.   ? ?ACTIVITY LIMITATIONS cleaning, meal prep, occupation, yard work, and shopping.  ? ?PERSONAL FACTORS 1-2 comorbidities: spinal stenosis and depression   are also affecting patient's functional outcome.  ? ? ?REHAB POTENTIAL: Excellent ? ?CLINICAL DECISION MAKING: Stable/uncomplicated improving pain over the past few weeks, just limitations in fucntion  ? ?EVALUATION COMPLEXITY: Low ? ? ?GOALS: ?Goals reviewed with patient? Yes ? ?SHORT TERM GOALS: ? ?STG Name Target Date Goal status ?3/10  ?1 Patient will increase shoulder flexion strength past 90 degrees to 4+/5 ?Baseline:  07/21/2021 Not tested  ?Ongoing   ?2 Patient will increase active left shoulder flexion to 120 degrees  ?Baseline:  07/21/2021 Progressing above 90 without pain   ?3 Patient will be independent with basic HEP  ?Baseline: 07/21/2021 Working on given exercises Achieved   ?LONG TERM GOALS:  ? ?LTG Name Target Date Goal status  ?1 Patient will increase gross left shoulder strength to 4+/5 in order to use his right arm for ADL's  ?Baseline: 08/11/2021 INITIAL  ?2 Patient will return to golf  ?Baseline: 08/11/2021 INITIAL  ?3 Patient will reach behind his head without pain  ?Baseline: 08/11/2021 INITIAL  ?PLAN: ?PT FREQUENCY: 1-2x/week ? ?PT DURATION: 6 weeks ? ?PLANNED INTERVENTIONS: Therapeutic exercises, Therapeutic activity, Neuro Muscular re-education, Patient/Family education, Joint mobilization, Electrical stimulation, Cryotherapy, Moist heat, Ultrasound, and Manual therapy ? ?PLAN FOR NEXT SESSION: econtinue to proess HEP  ? ?Lorayne Bender PT DPT  ?06/30/21 12:43 PM ? ?

## 2021-07-05 ENCOUNTER — Encounter (HOSPITAL_BASED_OUTPATIENT_CLINIC_OR_DEPARTMENT_OTHER): Payer: Self-pay | Admitting: Physical Therapy

## 2021-07-05 ENCOUNTER — Ambulatory Visit (HOSPITAL_BASED_OUTPATIENT_CLINIC_OR_DEPARTMENT_OTHER): Payer: Medicare HMO | Admitting: Physical Therapy

## 2021-07-05 DIAGNOSIS — M25611 Stiffness of right shoulder, not elsewhere classified: Secondary | ICD-10-CM | POA: Diagnosis not present

## 2021-07-05 DIAGNOSIS — M6281 Muscle weakness (generalized): Secondary | ICD-10-CM | POA: Diagnosis not present

## 2021-07-05 DIAGNOSIS — G8929 Other chronic pain: Secondary | ICD-10-CM

## 2021-07-05 DIAGNOSIS — M25511 Pain in right shoulder: Secondary | ICD-10-CM | POA: Diagnosis not present

## 2021-07-05 NOTE — Therapy (Signed)
?OUTPATIENT PHYSICAL THERAPY SHOULDER TREATMENT ? ? ?Patient Name: Kevin Bartlett ?MRN: [id number] ?DOB:Aug 07, 1950, 71 y.o., male ?Today's Date: 07/05/2021 ? ? PT End of Session - 07/05/21 1135   ? ? Visit Number 8   ? Number of Visits 11   ? Date for PT Re-Evaluation 07/28/21   ? PT Start Time 1100   ? PT Stop Time 1140   ? PT Time Calculation (min) 40 min   ? Activity Tolerance Patient tolerated treatment well   ? Behavior During Therapy [facility] for tasks assessed/performed   ? ?  ?  ? ?  ? ? ? ? ?Past Medical History:  ?Diagnosis Date  ? Arthritis   ? back, knees  ? Asthma   ? Depression   ? Erectile dysfunction `  ? HTN (hypertension)   ? OSA (obstructive sleep apnea)   ? uses CPAP most of time  ? Spinal stenosis   ? ?Past Surgical History:  ?Procedure Laterality Date  ? APPENDECTOMY    ? CARPAL TUNNEL RELEASE Left 03/22/2016  ? Procedure: LEFT CARPAL TUNNEL RELEASE FASCIECTOMY LEFT RING FINGER;  Surgeon: Cindee Salt, MD;  Location: [facility];  Service: Orthopedics;  Laterality: Left;  AXILLARY BLOCK  ? CARPAL TUNNEL RELEASE Right 05/15/2016  ? Procedure: RIGHT CARPAL TUNNEL RELEASE;  Surgeon: Cindee Salt, MD;  Location: [facility];  Service: Orthopedics;  Laterality: Right;  ? FASCIECTOMY Left 03/22/2016  ? Procedure: FASCIECTOMY LEFT RING FINGER;  Surgeon: Cindee Salt, MD;  Location: [facility];  Service: Orthopedics;  Laterality: Left;  ? INGUINAL HERNIA REPAIR Bilateral   ? ?Patient Active Problem List  ? Diagnosis Date Noted  ? Obesity (BMI 30-39.9)   ? HTN (hypertension)   ? Erectile dysfunction   ? OSA (obstructive sleep apnea)   ? Guaiac positive stools   ? Asthma   ? Depression   ? Spinal stenosis   ? ? ?PCP: Blair Heys, MD ? ?REFERRING PROVIDER: Blair Heys, MD ? ?REFERRING DIAG: right shoulder pain  ? ?THERAPY DIAG:  ?Chronic right shoulder pain ? ?Stiffness of right shoulder, not elsewhere classified ? ?Muscle weakness (generalized) ? ? ?ONSET DATE: Late  December  ? ?SUBJECTIVE:                                                                                                                                                                                     ? ?SUBJECTIVE STATEMENT: ?The paitnet reports the shoulder bursn in the orning. He puts heat on it and within 30 minutes it is better. He is then able to go through his day.  ? ?PERTINENT HISTORY: ?Spinal stenosis;  carpal tunnel release on both sides. Depression .  ? ?PAIN:  ?Are you having pain? no ?NPRS scale: 0/10 ?Pain location: right shoulder  ?Pain orientation: Right and Posterior  ?PAIN TYPE:  ?Pain description: intermittent  ?Aggravating factors: use of the shoulder  ?Relieving factors: ibuprofen  ? ?PRECAUTIONS: None ( smokes Cigars)  ? ?WEIGHT BEARING RESTRICTIONS No ? ?FALLS:  ?Has patient fallen in last 6 months? No Number of falls:  ? ?LIVING ENVIRONMENT: ?Nothing pertinent  ?OCCUPATION: ?Retired  ? ?Hobbies:  ?Plays golf  ? ? ?PLOF: Independent ? ?PATIENT GOALS  ? ?To improve the use of right hand.  ? ?OBJECTIVE:  ? ? ?4/14  ?AROM: minor pain with right end range flexion otherwise full  ?MMT ? ?Right shoulder flexion 4/5 over 90 degrees with minor pain  ?Below 90 4+/5 ? ?Mild tenderness to palpation in the anterior shoulder and upper trap ? ? ?  ?TODAY'S TREATMENT:  ?4/19 ?Supine Shoulder Flexion Extension AAROM with Dowel x20 2lbs  ?Supine ABC 3lbs  ? ?SLE ER 2lb 2x10  ? ?Band row x20 blue  ?Band extension x20 blue  ? ? ? ? ?Band IR x20 Blue  ? ?Shelf reach 2lbs 2x10  ?Finger ladder 2x3 to step 12  ?Manual: trigger point releaser to upper trap;  Inferior and posterior glides to right shoudler ? ? ? ?4/14 ?Supine Shoulder Flexion Extension AAROM with Dowel x20 2lbs  ?Supine ABC 3lbs  ? ?SLE ER 2lb 2x10  ? ?Band row x20 blue  ?Band extension x20 blue  ? ? ? ? ?Band IR x20 green  ? ?Shelf reach 2lbs 2x10  ?Finger ladder 2x3 to step 12  ? ? ?3/29 ?Manual: trigger point releaser to upper trap;  Inferior  and posterior glides to right shoudler ? ?Right arm supine flexion 2x10 1lb 2x10  ?Sine lying ER 1lb 2x15  ? ? ? ?3/23 ?Supine Shoulder Flexion Extension AAROM with Dowel x20 2lbs  ?Supine ABC 3lbs  ? ?SLE ER 2lb 2x10  ? ?Band row x20 blue  ?Band extension x20 blue  ? ?Wall flexion 2x10 with towel  ? ?Band ER red x20  ?Band IR x20  ? ? ? ? ?3/15 ? ?Supine Shoulder Flexion Extension AAROM with Dowel x20 2lbs  ?Supine ABC 2lbs  ? ?SLE ER 2lb 2x10  ? ?Towel flexion on wall x20  ?Cable row x20 10 lbs  ?Cable extension x20 10 lbs  ? ? ?                       Standing chop for golf swing 10 lbs bilateral cable machine  ?Pallof press 10 lbs x10 bilateral  ? ?PROM into end range flexion and ER  ? ? ? ?PATIENT EDUCATION: ?Education details: reviewed HEP and symptom management; importance of posture and scapular strengthening ?Person educated: Patient ?Education method: Explanation, Demonstration, Tactile cues, Verbal cues, ?Education comprehension: verbalized understanding, returned demonstration, verbal cues required, tactile cues required, and needs further education ? ? ?HOME EXERCISE PROGRAM: ?Access Code: [email or web address] ?URL: [email or web address] ?Date: 05/05/2021 ?Prepared by: Lorayne Benderavid Iria Jamerson ? ?Exercises ?Supine Shoulder Flexion Extension AAROM with Dowel 3 sets - 10 reps ?Shoulder extension with resistance - Neutral  3 sets - 10 reps ?Scapular Retraction with Resistance  3 sets - 10 reps ?Standing Shoulder Internal Rotation with Anchored Resistance 3 sets - 10 reps ? ? ?ASSESSMENT: ? ?CLINICAL IMPRESSION: ?Therapy advanced band IR to blue. He had some pain about halfway through te second  set. He was advised to listen to ist at home. We tried the second shelf with the 2 pound weight, but he was unable to perform. This may be baseline for him, but he was encouraged to keep working on this at home. Therapy will continue t progress as tolerated.  ? ? ?OBJECTIVE IMPAIRMENTS decreased activity tolerance, decreased  ROM, decreased strength, increased fascial restrictions, impaired UE functional use, and pain.  ? ?ACTIVITY LIMITATIONS cleaning, meal prep, occupation, yard work, and shopping.  ? ?PERSONAL FACTORS 1-2 comorbidities: spinal stenosis and depression   are also affecting patient's functional outcome.  ? ? ?REHAB POTENTIAL: Excellent ? ?CLINICAL DECISION MAKING: Stable/uncomplicated improving pain over the past few weeks, just limitations in fucntion  ? ?EVALUATION COMPLEXITY: Low ? ? ?GOALS: ?Goals reviewed with patient? Yes ? ?SHORT TERM GOALS: ? ?STG Name Target Date Goal status ?3/10  ?1 Patient will increase shoulder flexion strength past 90 degrees to 4+/5 ?Baseline:  07/26/2021 Not tested  ?Ongoing   ?2 Patient will increase active left shoulder flexion to 120 degrees  ?Baseline:  07/26/2021 Progressing above 90 without pain   ?3 Patient will be independent with basic HEP  ?Baseline: 07/26/2021 Working on given exercises Achieved   ?LONG TERM GOALS:  ? ?LTG Name Target Date Goal status  ?1 Patient will increase gross left shoulder strength to 4+/5 in order to use his right arm for ADL's  ?Baseline: 08/16/2021 INITIAL  ?2 Patient will return to golf  ?Baseline: 08/16/2021 INITIAL  ?3 Patient will reach behind his head without pain  ?Baseline: 08/16/2021 INITIAL  ?PLAN: ?PT FREQUENCY: 1-2x/week ? ?PT DURATION: 6 weeks ? ?PLANNED INTERVENTIONS: Therapeutic exercises, Therapeutic activity, Neuro Muscular re-education, Patient/Family education, Joint mobilization, Electrical stimulation, Cryotherapy, Moist heat, Ultrasound, and Manual therapy ? ?PLAN FOR NEXT SESSION: econtinue to proess HEP  ? ?Lorayne Bender PT DPT  ?07/05/21 3:43 PM ?  ?Referring diagnosis? S46.011A (ICD-10-CM) - Traumatic complete tear of right rotator cuff, initial encounter ?Treatment diagnosis? (if different than referring diagnosis) S46.011A (ICD-10-CM) - Traumatic complete tear of right rotator cuff, initial encounter ?2/17- 3/31 ?  ?12 visits  ?   ?What was this (referring dx) caused by? ?[]  Surgery ?[]  Fall ?[x]  Ongoing issue ?[]  Arthritis ?[]  Other: ____________ ?  ?Laterality: ?[]  Rt ?[x]  Lt ?[]  Both ?  ?Check all possible CPT codes:

## 2021-07-12 ENCOUNTER — Encounter (HOSPITAL_BASED_OUTPATIENT_CLINIC_OR_DEPARTMENT_OTHER): Payer: Self-pay | Admitting: Physical Therapy

## 2021-07-12 ENCOUNTER — Ambulatory Visit (HOSPITAL_BASED_OUTPATIENT_CLINIC_OR_DEPARTMENT_OTHER): Payer: Medicare HMO | Admitting: Physical Therapy

## 2021-07-12 DIAGNOSIS — G8929 Other chronic pain: Secondary | ICD-10-CM

## 2021-07-12 DIAGNOSIS — M25511 Pain in right shoulder: Secondary | ICD-10-CM | POA: Diagnosis not present

## 2021-07-12 DIAGNOSIS — M6281 Muscle weakness (generalized): Secondary | ICD-10-CM | POA: Diagnosis not present

## 2021-07-12 DIAGNOSIS — M25611 Stiffness of right shoulder, not elsewhere classified: Secondary | ICD-10-CM

## 2021-07-12 NOTE — Therapy (Signed)
?OUTPATIENT PHYSICAL THERAPY SHOULDER TREATMENT/Discharge  ? ? ?Patient Name: Kevin Bartlett ?MRN: [id number] ?DOB:Dec 16, 1950, 71 y.o., male ?Today's Date: 07/12/2021 ? ? PT End of Session - 07/12/21 1107   ? ? Visit Number 9   ? Number of Visits 11   ? Date for PT Re-Evaluation 07/28/21   ? PT Start Time 1100   ? PT Stop Time [id number]   ? PT Time Calculation (min) 43 min   ? Activity Tolerance Patient tolerated treatment well   ? Behavior During Therapy [facility] for tasks assessed/performed   ? ?  ?  ? ?  ? ? ? ? ?Past Medical History:  ?Diagnosis Date  ? Arthritis   ? back, knees  ? Asthma   ? Depression   ? Erectile dysfunction `  ? HTN (hypertension)   ? OSA (obstructive sleep apnea)   ? uses CPAP most of time  ? Spinal stenosis   ? ?Past Surgical History:  ?Procedure Laterality Date  ? APPENDECTOMY    ? CARPAL TUNNEL RELEASE Left 03/22/2016  ? Procedure: LEFT CARPAL TUNNEL RELEASE FASCIECTOMY LEFT RING FINGER;  Surgeon: Daryll Brod, MD;  Location: [place];  Service: Orthopedics;  Laterality: Left;  AXILLARY BLOCK  ? CARPAL TUNNEL RELEASE Right 05/15/2016  ? Procedure: RIGHT CARPAL TUNNEL RELEASE;  Surgeon: Daryll Brod, MD;  Location: [place];  Service: Orthopedics;  Laterality: Right;  ? FASCIECTOMY Left 03/22/2016  ? Procedure: FASCIECTOMY LEFT RING FINGER;  Surgeon: Daryll Brod, MD;  Location: [facility];  Service: Orthopedics;  Laterality: Left;  ? INGUINAL HERNIA REPAIR Bilateral   ? ?Patient Active Problem List  ? Diagnosis Date Noted  ? Obesity (BMI 30-39.9)   ? HTN (hypertension)   ? Erectile dysfunction   ? OSA (obstructive sleep apnea)   ? Guaiac positive stools   ? Asthma   ? Depression   ? Spinal stenosis   ? ? ?PCP: Gaynelle Arabian, MD ? ?REFERRING PROVIDER: Gaynelle Arabian, MD ? ?REFERRING DIAG: right shoulder pain  ? ?THERAPY DIAG:  ?No diagnosis found. ? ? ?ONSET DATE: Late December  ? ?SUBJECTIVE:                                                                                                                                                                                      ? ?SUBJECTIVE STATEMENT: ?The patient reports his shoulder has been about the same.  ? ?PERTINENT HISTORY: ?Spinal stenosis; carpal tunnel release on both sides. Depression .  ? ?PAIN:  ?Are you having pain? No not at this time but does get sore in the morning 4/26  ?NPRS scale:  0/10 ?Pain location: right shoulder  ?Pain orientation: Right and Posterior  ?PAIN TYPE:  ?Pain description: intermittent  ?Aggravating factors: use of the shoulder  ?Relieving factors: ibuprofen  ? ?PRECAUTIONS: None ( smokes Cigars)  ? ?WEIGHT BEARING RESTRICTIONS No ? ?FALLS:  ?Has patient fallen in last 6 months? No Number of falls:  ? ?LIVING ENVIRONMENT: ?Nothing pertinent  ?OCCUPATION: ?Retired  ? ?Hobbies:  ?Plays golf  ? ?Continues to have limited active ROM with pain in flexion. This may be his baseline  ? ?PLOF: Independent ? ?PATIENT GOALS  ? ?To improve the use of right hand.  ? ?OBJECTIVE:  ?4/27 ? ? ?4/14  ?AROM: minor pain with right end range flexion otherwise full  ?MMT ? ?Right shoulder flexion 4/5 over 90 degrees with minor pain  ?Below 90 4+/5 ? ?Mild tenderness to palpation in the anterior shoulder and upper trap ? ? ?  ?TODAY'S TREATMENT:  ?4/27 ?Supine Shoulder Flexion Extension AAROM with Dowel x20 2lbs  ?Supine ABC 3lbs  ? ?SLE ER 2lb 2x10  ? ?Band row x20 blue  ?Band extension x20 blue  ? ? ? ? ?Band IR x20 Blue  ? ?Shelf reach 2lbs 2x10  ?Finger ladder 2x3 to step 12  ?Manual: trigger point releaser to upper trap;  Inferior and posterior glides to right shoudler ?4/19 ?Supine Shoulder Flexion Extension AAROM with Dowel x20 2lbs  ?Supine ABC 3lbs  ? ?SLE ER 2lb 2x10  ? ?Band row x20 blue  ?Band extension x20 blue  ? ? ? ? ?Band IR x20 Blue  ? ?Shelf reach 2lbs 2x10  ?Finger ladder 2x3 to step 12  ?Manual: trigger point releaser to upper trap;  Inferior and posterior glides to right  shoudler ? ? ? ?4/14 ?Supine Shoulder Flexion Extension AAROM with Dowel x20 2lbs  ?Supine ABC 3lbs  ? ?SLE ER 2lb 2x10  ? ?Band row x20 blue  ?Band extension x20 blue  ? ? ? ? ?Band IR x20 green  ? ?Shelf reach 2lbs 2x10  ?Finger ladder 2x3 to step 12  ? ? ? ? ?PATIENT EDUCATION: ?Education details: reviewed final HEP  ?Person educated: Patient ?Education method: Explanation, Demonstration, Tactile cues, Verbal cues, ?Education comprehension: verbalized understanding, returned demonstration, verbal cues required, tactile cues required, and needs further education ? ? ?HOME EXERCISE PROGRAM: ?Access Code: [email or web address] ?URL: [email or web address] ?Date: 05/05/2021 ?Prepared by: Carolyne Littles ? ?Exercises ?Supine Shoulder Flexion Extension AAROM with Dowel 3 sets - 10 reps ?Shoulder extension with resistance - Neutral  3 sets - 10 reps ?Scapular Retraction with Resistance  3 sets - 10 reps ?Standing Shoulder Internal Rotation with Anchored Resistance 3 sets - 10 reps ? ? ?ASSESSMENT: ? ?CLINICAL IMPRESSION: ?The patient has reached max potential for therapy. He continues to have mild to moderate pain in the mornings and limited forward flexion past 90 degrees. For the most part he is functional though. He is able to use his arm for ADLs and can reach to about 110 degrees with pain after. He is not sure what his pln will be. He will wait until the fall either way to have surgery f he does. He was advised to use his plan and continue strengthening. See below for goal specific progress.  ? ?OBJECTIVE IMPAIRMENTS decreased activity tolerance, decreased ROM, decreased strength, increased fascial restrictions, impaired UE functional use, and pain.  ? ?ACTIVITY LIMITATIONS cleaning, meal prep, occupation, yard work, and shopping.  ? ?PERSONAL FACTORS 1-2 comorbidities: spinal stenosis and depression  are also affecting patient's functional outcome.  ? ? ?REHAB POTENTIAL: Excellent ? ?CLINICAL DECISION MAKING:  Stable/uncomplicated improving pain over the past few weeks, just limitations in fucntion  ? ?EVALUATION COMPLEXITY: Low ? ? ?GOALS: ?Goals reviewed with patient? Yes ? ?SHORT TERM GOALS: ? ?STG Name Target Date Goal status ?3/10  ?1 Patient will increase shoulder flexion strength past 90 degrees to 4+/5 ?Baseline:  08/02/2021 5/5 below 90 degrees   ?2 Patient will increase active left shoulder flexion to 120 degrees  ?Baseline:  08/02/2021 Progressing above 90 without pain   ?3 Patient will be independent with basic HEP  ?Baseline: 08/02/2021 Working on given exercises Achieved   ?LONG TERM GOALS:  ? ?LTG Name Target Date Goal status  ?1 Patient will increase gross left shoulder strength to 4+/5 in order to use his right arm for ADL's  ?Baseline: 08/23/2021 Using for ADL's achieved   ?2 Patient will return to golf  ?Baseline: 08/23/2021 Playing golf without pain   ?3 Patient will reach behind his head without pain  ?Baseline: 08/23/2021 Can reach behind his back without pain   ?PLAN: ?PT FREQUENCY: 1-2x/week ? ?PT DURATION: 6 weeks ? ?PLANNED INTERVENTIONS: Therapeutic exercises, Therapeutic activity, Neuro Muscular re-education, Patient/Family education, Joint mobilization, Electrical stimulation, Cryotherapy, Moist heat, Ultrasound, and Manual therapy ? ?PLAN FOR NEXT SESSION: econtinue to proess HEP  ? ?Carolyne Littles PT DPT  ?07/12/21 11:14 AM ?  ?Referring diagnosis? S46.011A (ICD-10-CM) - Traumatic complete tear of right rotator cuff, initial encounter ?Treatment diagnosis? (if different than referring diagnosis) S46.011A (ICD-10-CM) - Traumatic complete tear of right rotator cuff, initial encounter ?2/17- 3/31 ?  ?12 visits  ?  ?What was this (referring dx) caused by? ?[]  Surgery ?[]  Fall ?[x]  Ongoing issue ?[]  Arthritis ?[]  Other: ____________ ?  ?Laterality: ?[]  Rt ?[x]  Lt ?[]  Both ?  ?Check all possible CPT codes:                  *CHOOSE 10 OR LESS*                                ?[]  97110 (Therapeutic Exercise)                   []  92507 (SLP Treatment)      ?[]  [id number] (Neuro Re-ed)                               []  92526 (Swallowing Treatment)     ?           []  [id number] (Gait Training)                                []  [id number] (Cognitive T

## 2021-07-13 ENCOUNTER — Encounter (HOSPITAL_BASED_OUTPATIENT_CLINIC_OR_DEPARTMENT_OTHER): Payer: Self-pay | Admitting: Physical Therapy

## 2021-12-06 DIAGNOSIS — M5416 Radiculopathy, lumbar region: Secondary | ICD-10-CM | POA: Diagnosis not present

## 2021-12-06 DIAGNOSIS — G4733 Obstructive sleep apnea (adult) (pediatric): Secondary | ICD-10-CM | POA: Diagnosis not present

## 2021-12-06 DIAGNOSIS — M48061 Spinal stenosis, lumbar region without neurogenic claudication: Secondary | ICD-10-CM | POA: Diagnosis not present

## 2021-12-06 DIAGNOSIS — Z125 Encounter for screening for malignant neoplasm of prostate: Secondary | ICD-10-CM | POA: Diagnosis not present

## 2021-12-06 DIAGNOSIS — R634 Abnormal weight loss: Secondary | ICD-10-CM | POA: Diagnosis not present

## 2021-12-06 DIAGNOSIS — Z0000 Encounter for general adult medical examination without abnormal findings: Secondary | ICD-10-CM | POA: Diagnosis not present

## 2021-12-06 DIAGNOSIS — Z1331 Encounter for screening for depression: Secondary | ICD-10-CM | POA: Diagnosis not present

## 2021-12-06 DIAGNOSIS — G894 Chronic pain syndrome: Secondary | ICD-10-CM | POA: Diagnosis not present

## 2021-12-06 DIAGNOSIS — E781 Pure hyperglyceridemia: Secondary | ICD-10-CM | POA: Diagnosis not present

## 2021-12-06 DIAGNOSIS — D692 Other nonthrombocytopenic purpura: Secondary | ICD-10-CM | POA: Diagnosis not present

## 2021-12-06 DIAGNOSIS — I10 Essential (primary) hypertension: Secondary | ICD-10-CM | POA: Diagnosis not present

## 2021-12-13 DIAGNOSIS — H2513 Age-related nuclear cataract, bilateral: Secondary | ICD-10-CM | POA: Diagnosis not present

## 2021-12-13 DIAGNOSIS — Z0100 Encounter for examination of eyes and vision without abnormal findings: Secondary | ICD-10-CM | POA: Diagnosis not present

## 2021-12-13 DIAGNOSIS — H43813 Vitreous degeneration, bilateral: Secondary | ICD-10-CM | POA: Diagnosis not present

## 2022-05-01 IMAGING — DX DG SHOULDER 2+V*R*
3 series · 3 of 3 positions shown · non-contrast
Comparison: None.

CLINICAL DATA: Atraumatic right shoulder pain.

EXAM:
RIGHT SHOULDER - 2+ VIEW

[shoulder grashey]
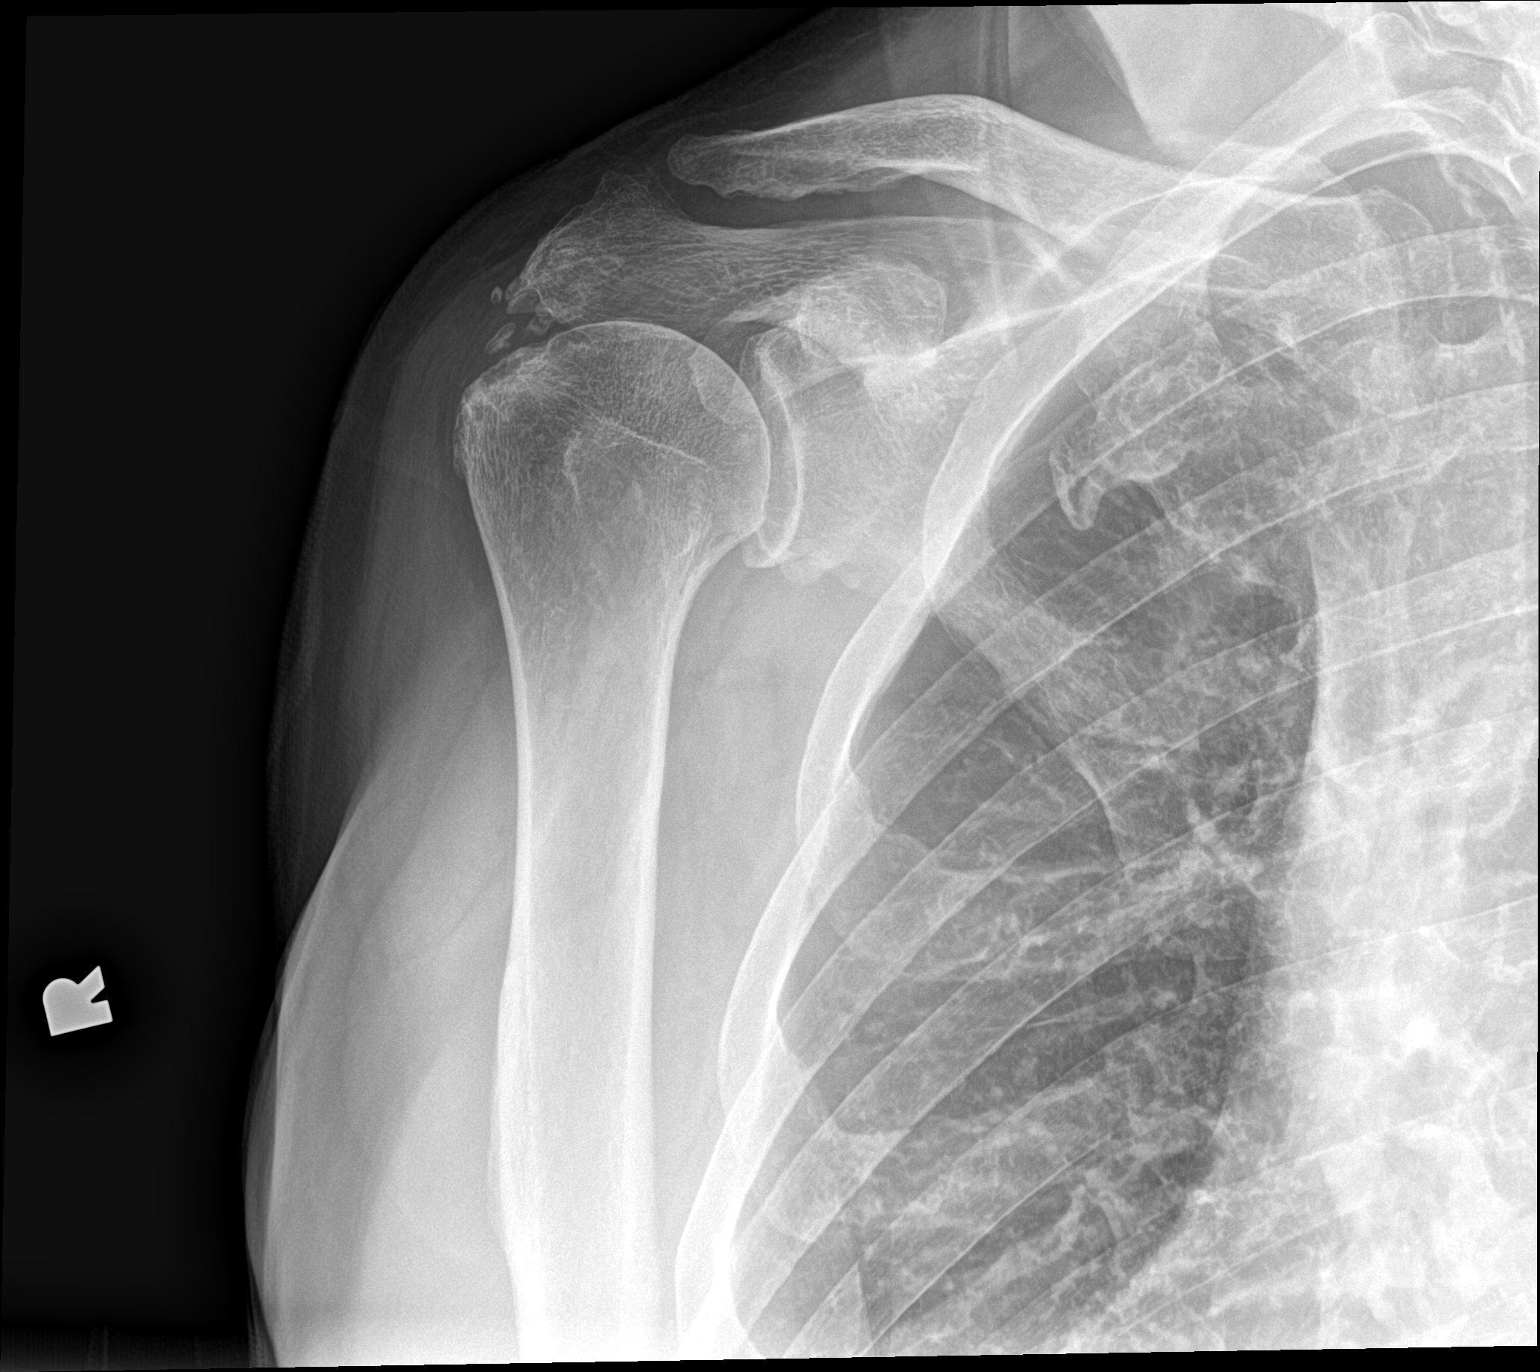

[shoulder y view]
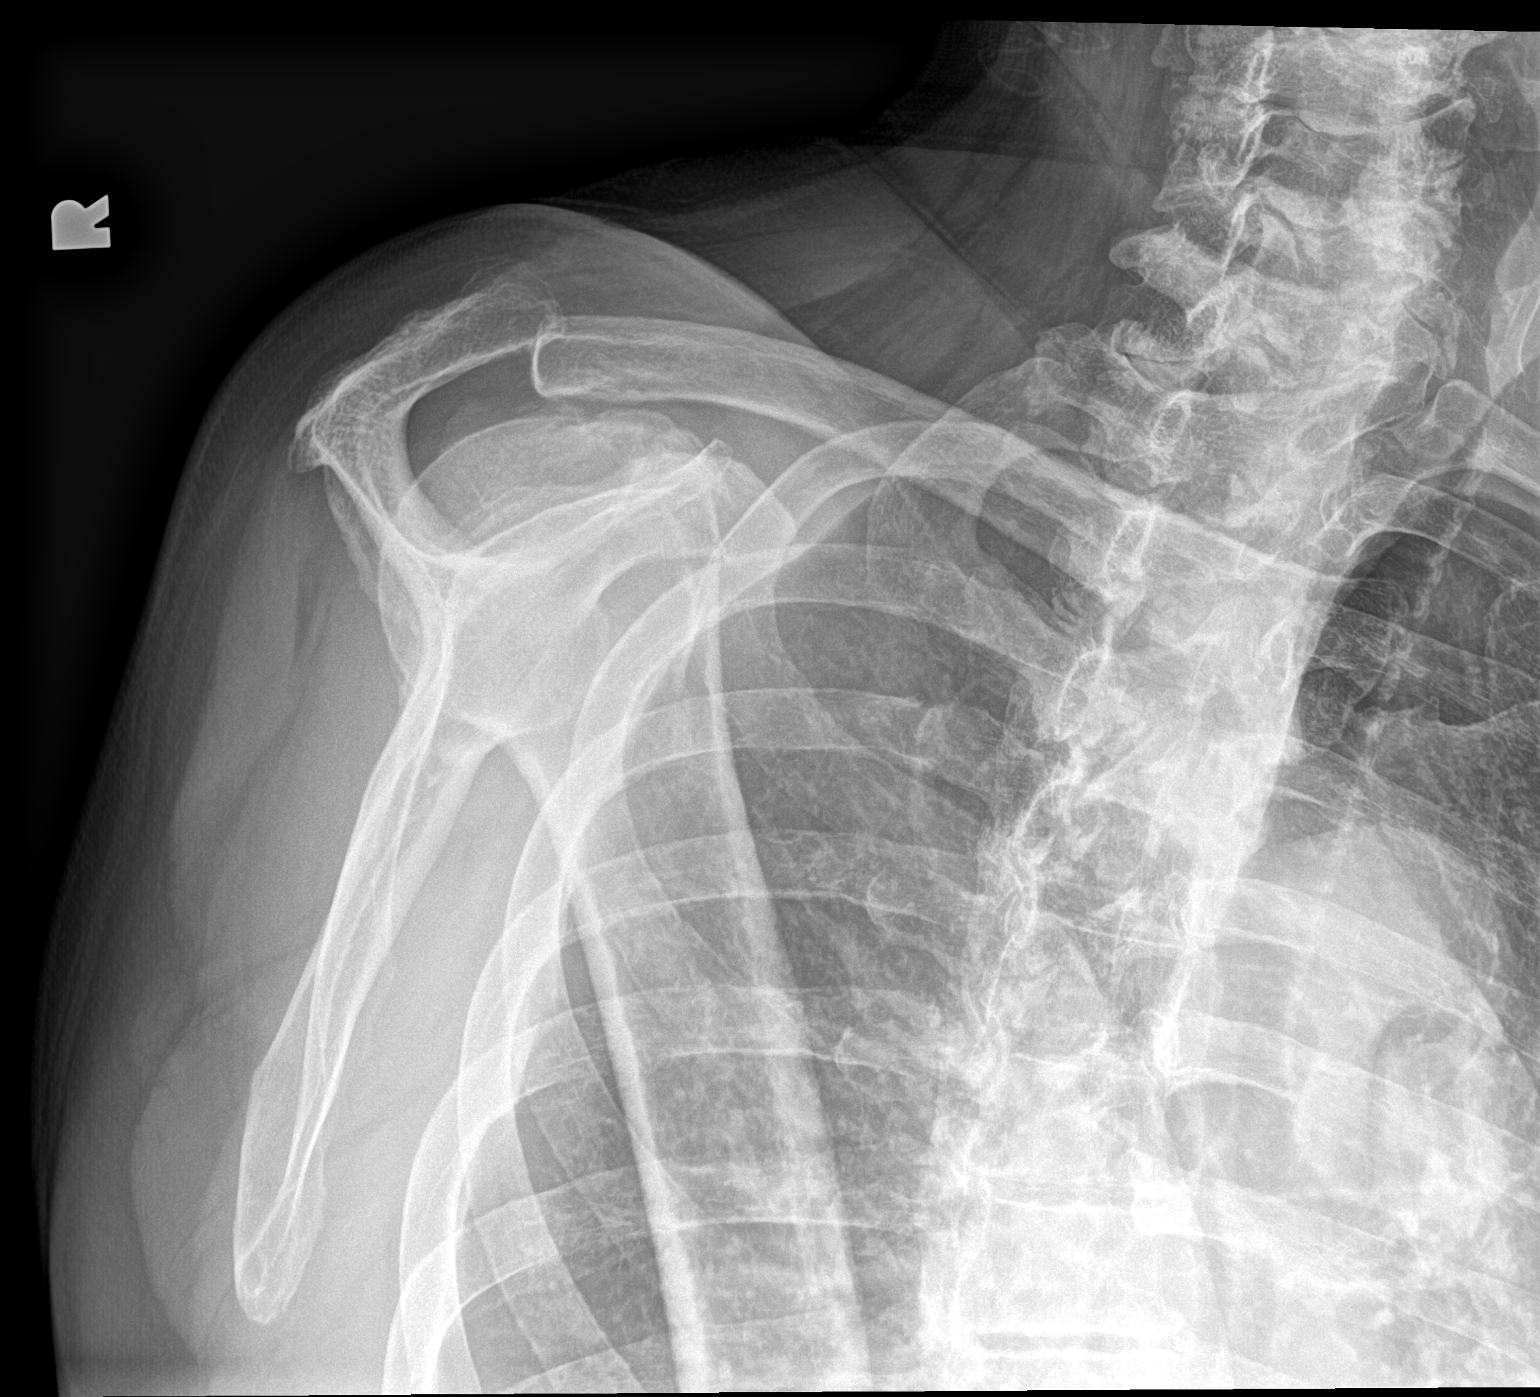

[shoulder axillary]
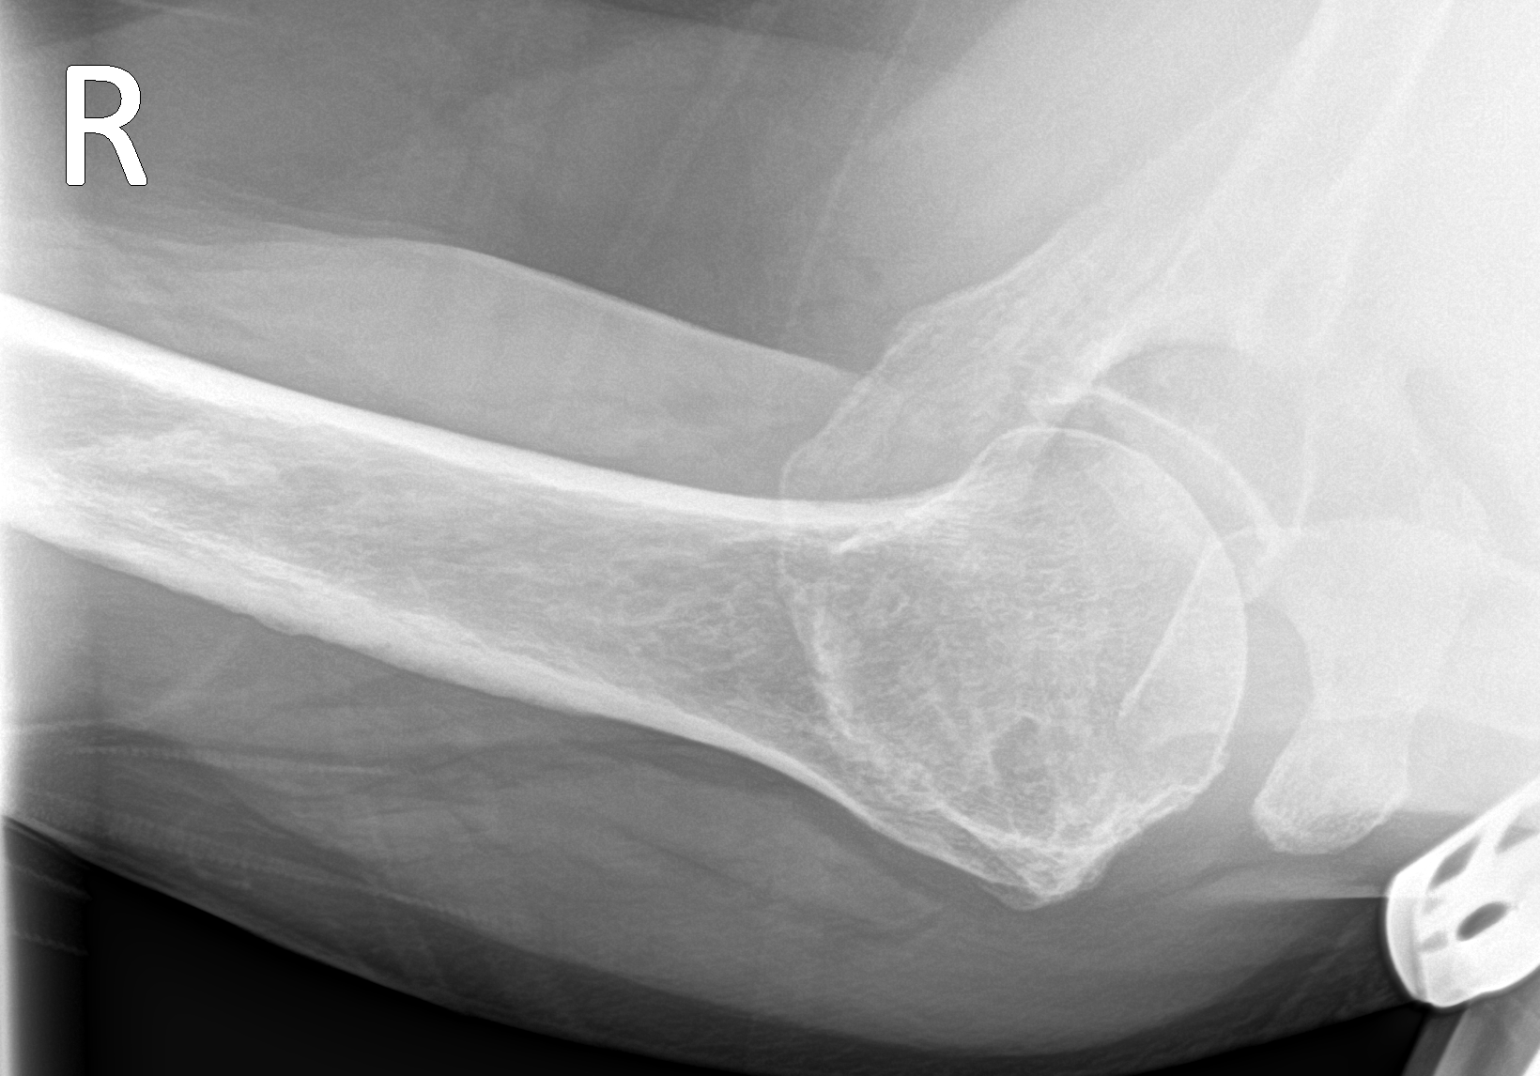

[3 of 3 positions shown; findings below may reference images not displayed]

FINDINGS: There is no evidence of an acute fracture or dislocation. Chronic
changes are seen adjacent to the lateral aspect of the right
acromion. Degenerative changes seen involving the right
acromioclavicular joint and right glenohumeral articulation. Soft
tissues are unremarkable.
IMPRESSION: Degenerative changes without evidence of acute fracture or
dislocation.

## 2022-06-06 ENCOUNTER — Ambulatory Visit
Admission: RE | Admit: 2022-06-06 | Discharge: 2022-06-06 | Disposition: A | Discharge status: Home / Self Care | Payer: Medicare HMO | Source: Ambulatory Visit | Attending: Family Medicine | Admitting: Family Medicine

## 2022-06-06 ENCOUNTER — Other Ambulatory Visit: Payer: Self-pay | Admitting: Family Medicine

## 2022-06-06 DIAGNOSIS — R634 Abnormal weight loss: Secondary | ICD-10-CM

## 2022-06-06 DIAGNOSIS — G894 Chronic pain syndrome: Secondary | ICD-10-CM | POA: Diagnosis not present

## 2022-06-06 DIAGNOSIS — G4733 Obstructive sleep apnea (adult) (pediatric): Secondary | ICD-10-CM | POA: Diagnosis not present

## 2022-06-06 DIAGNOSIS — E781 Pure hyperglyceridemia: Secondary | ICD-10-CM | POA: Diagnosis not present

## 2022-06-06 DIAGNOSIS — I10 Essential (primary) hypertension: Secondary | ICD-10-CM | POA: Diagnosis not present

## 2022-06-06 DIAGNOSIS — M48061 Spinal stenosis, lumbar region without neurogenic claudication: Secondary | ICD-10-CM | POA: Diagnosis not present

## 2022-06-06 DIAGNOSIS — M5416 Radiculopathy, lumbar region: Secondary | ICD-10-CM | POA: Diagnosis not present

## 2022-12-11 DIAGNOSIS — M48061 Spinal stenosis, lumbar region without neurogenic claudication: Secondary | ICD-10-CM | POA: Diagnosis not present

## 2022-12-11 DIAGNOSIS — M5416 Radiculopathy, lumbar region: Secondary | ICD-10-CM | POA: Diagnosis not present

## 2022-12-11 DIAGNOSIS — I10 Essential (primary) hypertension: Secondary | ICD-10-CM | POA: Diagnosis not present

## 2022-12-11 DIAGNOSIS — Z1331 Encounter for screening for depression: Secondary | ICD-10-CM | POA: Diagnosis not present

## 2022-12-11 DIAGNOSIS — G4733 Obstructive sleep apnea (adult) (pediatric): Secondary | ICD-10-CM | POA: Diagnosis not present

## 2022-12-11 DIAGNOSIS — Z0000 Encounter for general adult medical examination without abnormal findings: Secondary | ICD-10-CM | POA: Diagnosis not present

## 2022-12-11 DIAGNOSIS — E781 Pure hyperglyceridemia: Secondary | ICD-10-CM | POA: Diagnosis not present

## 2022-12-11 DIAGNOSIS — G894 Chronic pain syndrome: Secondary | ICD-10-CM | POA: Diagnosis not present

## 2022-12-11 DIAGNOSIS — Z125 Encounter for screening for malignant neoplasm of prostate: Secondary | ICD-10-CM | POA: Diagnosis not present

## 2022-12-19 DIAGNOSIS — H25043 Posterior subcapsular polar age-related cataract, bilateral: Secondary | ICD-10-CM | POA: Diagnosis not present

## 2022-12-19 DIAGNOSIS — H11153 Pinguecula, bilateral: Secondary | ICD-10-CM | POA: Diagnosis not present

## 2022-12-19 DIAGNOSIS — H43813 Vitreous degeneration, bilateral: Secondary | ICD-10-CM | POA: Diagnosis not present

## 2022-12-19 DIAGNOSIS — H40013 Open angle with borderline findings, low risk, bilateral: Secondary | ICD-10-CM | POA: Diagnosis not present

## 2022-12-19 DIAGNOSIS — H2513 Age-related nuclear cataract, bilateral: Secondary | ICD-10-CM | POA: Diagnosis not present

## 2023-01-04 DIAGNOSIS — M48061 Spinal stenosis, lumbar region without neurogenic claudication: Secondary | ICD-10-CM | POA: Diagnosis not present

## 2023-01-04 DIAGNOSIS — I10 Essential (primary) hypertension: Secondary | ICD-10-CM | POA: Diagnosis not present

## 2023-01-04 DIAGNOSIS — G894 Chronic pain syndrome: Secondary | ICD-10-CM | POA: Diagnosis not present

## 2023-01-09 DIAGNOSIS — Z0100 Encounter for examination of eyes and vision without abnormal findings: Secondary | ICD-10-CM | POA: Diagnosis not present

## 2023-01-31 DIAGNOSIS — K635 Polyp of colon: Secondary | ICD-10-CM | POA: Diagnosis not present

## 2023-01-31 DIAGNOSIS — K5730 Diverticulosis of large intestine without perforation or abscess without bleeding: Secondary | ICD-10-CM | POA: Diagnosis not present

## 2023-01-31 DIAGNOSIS — K648 Other hemorrhoids: Secondary | ICD-10-CM | POA: Diagnosis not present

## 2023-01-31 DIAGNOSIS — Z1211 Encounter for screening for malignant neoplasm of colon: Secondary | ICD-10-CM | POA: Diagnosis not present

## 2023-02-04 DIAGNOSIS — K635 Polyp of colon: Secondary | ICD-10-CM | POA: Diagnosis not present

## 2023-09-19 NOTE — Progress Notes (Signed)
 Cardiology Office Note:    Date:  09/24/2023   ID:  Kevin Bartlett, DOB 02/17/1951, MRN [id number]  PCP:  Auston Opal, DO   [facility] Providers Cardiologist:  Peter Swaziland, MD Cardiology APP:  Madie Jon Garre, PA     Referring MD: Auston Opal, DO   Chief Complaint  Patient presents with   New Patient (Initial Visit)    Symptomatic bradycardia    History of Present Illness:    Kevin Bartlett is a 73 y.o. male with a hx of HTN, HLD, chronic pain syndrome, and new bradycardia.  Pt saw PCP 09/12/23 with fatigue found to have bradycardia with HR 32 and possible 2:1 AV block. PCP discontinued amitriptyline. Cardizem still listed on med list. Pt urgently referred to cardiology.   Today, he reports NO syncope, but elevated BP. He remains active, going grocery store, golf, plays pool. He played golf 10 days ago.   He lost 50 lbs in 4-6 months due to new dentures, but food didn't taste the same - PCP stopped cardizem 1 year ago. On no AV nodal agents. He reports fatigue.   PCP also stopped amitriptyline due to bradycardia. He now doesn't rest. He has been urinating more recently.    Past Medical History:  Diagnosis Date   Arthritis    back, knees   Asthma    Depression    Erectile dysfunction `   HTN (hypertension)    OSA (obstructive sleep apnea)    uses CPAP most of time   Spinal stenosis     Past Surgical History:  Procedure Laterality Date   APPENDECTOMY     CARPAL TUNNEL RELEASE Left 03/22/2016   Procedure: LEFT CARPAL TUNNEL RELEASE FASCIECTOMY LEFT RING FINGER;  Surgeon: Arley Curia, MD;  Location: [facility];  Service: Orthopedics;  Laterality: Left;  AXILLARY BLOCK   CARPAL TUNNEL RELEASE Right 05/15/2016   Procedure: RIGHT CARPAL TUNNEL RELEASE;  Surgeon: Arley Curia, MD;  Location: [facility];  Service: Orthopedics;  Laterality: Right;   FASCIECTOMY Left 03/22/2016   Procedure: FASCIECTOMY LEFT RING FINGER;  Surgeon:  Arley Curia, MD;  Location: [facility];  Service: Orthopedics;  Laterality: Left;   INGUINAL HERNIA REPAIR Bilateral     Current Medications: Current Meds  Medication Sig   amitriptyline (ELAVIL) 75 MG tablet Take by mouth at bedtime.   aspirin  81 MG tablet Take 81 mg by mouth daily.   cholecalciferol  (VITAMIN D ) 1000 units tablet Take 1,000 Units by mouth daily.   HYDROcodone -acetaminophen  (NORCO) 5-325 MG tablet Take 1 tablet by mouth every 6 (six) hours as needed for moderate pain.   meloxicam (MOBIC) 7.5 MG tablet Take 7.5 mg by mouth daily.   quinapril (ACCUPRIL) 10 MG tablet Take 10 mg by mouth at bedtime.   UNABLE TO FIND PK stimulant   vitamin B-12 (CYANOCOBALAMIN ) 100 MCG tablet Take 100 mcg by mouth daily.   [DISCONTINUED] diltiazem (TIAZAC) 180 MG 24 hr capsule Take 180 mg by mouth daily.     Allergies:   Patient has no known allergies.   Social History   Socioeconomic History   Marital status: Single    Spouse name: Not on file   Number of children: Not on file   Years of education: Not on file   Highest education level: Not on file  Occupational History   Not on file  Tobacco Use   Smoking status: Every Day    Types: Cigars  Last attempt to quit: 01/16/2003    Years since quitting: 20.7   Smokeless tobacco: Never  Substance and Sexual Activity   Alcohol use: Yes    Comment: social   Drug use: No   Sexual activity: Not on file  Other Topics Concern   Not on file  Social History Narrative   Not on file   Social Drivers of Health   Financial Resource Strain: Not on file  Food Insecurity: Not on file  Transportation Needs: Not on file  Physical Activity: Not on file  Stress: Not on file  Social Connections: Not on file     Family History: The patient's family history includes Heart disease in his father.  ROS:   Please see the history of present illness.     All other systems reviewed and are negative.  EKGs/Labs/Other Studies  Reviewed:    The following studies were reviewed today:  EKG Interpretation Date/Time:  Tuesday September 24 2023 08:23:53 EDT Ventricular Rate:  31 PR Interval:  214 QRS Duration:  94 QT Interval:  470 QTC Calculation: 337 R Axis:   0  Text Interpretation: Sinus rhythm with 2nd degree A-V block with 2:1 A-V conduction When compared with ECG of 20-Mar-2016 14:11, Sinus rhythm is now with 2nd degree A-V block Vent. rate has decreased BY  33 BPM QT has shortened Confirmed by Madie Slough ([id number]) on 09/24/2023 8:55:05 AM    Recent Labs: No results found for requested labs within last 365 days.  Recent Lipid Panel No results found for: CHOL, TRIG, HDL, CHOLHDL, VLDL, LDLCALC, LDLDIRECT   Risk Assessment/Calculations:      HYPERTENSION CONTROL Vitals:   09/24/23 0818 09/24/23 0915  BP: (!) 186/72 (!) 164/62    The patient's blood pressure is elevated above target today.  In order to address the patient's elevated BP: Blood pressure will be monitored at home to determine if medication changes need to be made.            Physical Exam:    VS:  BP (!) 164/62   Pulse (!) 30   Ht 5' 11 (1.803 m)   Wt 175 lb 12.8 oz (79.7 kg)   SpO2 96%   BMI 24.52 kg/m     Wt Readings from Last 3 Encounters:  09/24/23 175 lb 12.8 oz (79.7 kg)  05/15/16 198 lb (89.8 kg)  03/22/16 200 lb (90.7 kg)     GEN:  Well nourished, well developed in no acute distress HEENT: Normal NECK: No JVD; No carotid bruits LYMPHATICS: No lymphadenopathy CARDIAC: bradycardic rate, systolic murmur 3/6 RESPIRATORY:  Clear to auscultation without rales, wheezing or rhonchi  ABDOMEN: Soft, non-tender, non-distended MUSCULOSKELETAL:  No edema; No deformity  SKIN: Warm and dry NEUROLOGIC:  Alert and oriented x 3 PSYCHIATRIC:  Normal affect   ASSESSMENT:    1. Bradycardia   2. Mobitz type 2 second degree atrioventricular block   3. Primary hypertension   4. Hypertensive urgency   5. Cardiac  murmur    PLAN:    In order of problems listed above:  Symptomatic Bradycardia 2:1 heart block - he is on no AV nodal agents - feeling fatigued with hypertensive urgency   Hypertension Hypertensive urgency - has been maintained on quinapril 10 mg - 186 SBP --> 164/62 on recheck - will hold off on additional medications given HR   Cardiac murmur on exam - will need echocardiogram   Case discussed with DOD Dr. Swaziland and EP Dr. Inocencio  who agrees patient should be evaluated in the [facility].   I have dispatched EMS for transport.            Medication Adjustments/Labs and Tests Ordered: Current medicines are reviewed at length with the patient today.  Concerns regarding medicines are outlined above.  Orders Placed This Encounter  Procedures   EKG 12-Lead   No orders of the defined types were placed in this encounter.   There are no Patient Instructions on file for this visit.   Signed, Jon Nat Hails, [place]  09/24/2023 9:32 AM    [facility]

## 2023-09-24 ENCOUNTER — Emergency Department (HOSPITAL_COMMUNITY)

## 2023-09-24 ENCOUNTER — Observation Stay (HOSPITAL_COMMUNITY)
Admission: EM | Admit: 2023-09-24 | Discharge: 2023-09-25 | Disposition: A | Discharge status: Home / Self Care | Attending: Cardiology | Admitting: Cardiology

## 2023-09-24 ENCOUNTER — Encounter: Payer: Self-pay | Admitting: Physician Assistant

## 2023-09-24 ENCOUNTER — Ambulatory Visit: Attending: Physician Assistant | Admitting: Physician Assistant

## 2023-09-24 ENCOUNTER — Emergency Department (HOSPITAL_BASED_OUTPATIENT_CLINIC_OR_DEPARTMENT_OTHER)

## 2023-09-24 ENCOUNTER — Encounter (HOSPITAL_COMMUNITY)
Admission: EM | Disposition: A | Discharge status: Home / Self Care | Payer: Self-pay | Source: Home / Self Care | Attending: Emergency Medicine

## 2023-09-24 ENCOUNTER — Encounter (HOSPITAL_COMMUNITY): Payer: Self-pay

## 2023-09-24 ENCOUNTER — Other Ambulatory Visit: Payer: Self-pay

## 2023-09-24 VITALS — BP 164/62 | HR 30 | Ht 71.0 in | Wt 175.8 lb

## 2023-09-24 DIAGNOSIS — I160 Hypertensive urgency: Secondary | ICD-10-CM

## 2023-09-24 DIAGNOSIS — Z79899 Other long term (current) drug therapy: Secondary | ICD-10-CM | POA: Insufficient documentation

## 2023-09-24 DIAGNOSIS — Z7982 Long term (current) use of aspirin: Secondary | ICD-10-CM | POA: Diagnosis not present

## 2023-09-24 DIAGNOSIS — I10 Essential (primary) hypertension: Secondary | ICD-10-CM | POA: Diagnosis not present

## 2023-09-24 DIAGNOSIS — I441 Atrioventricular block, second degree: Secondary | ICD-10-CM | POA: Diagnosis not present

## 2023-09-24 DIAGNOSIS — R011 Cardiac murmur, unspecified: Secondary | ICD-10-CM

## 2023-09-24 DIAGNOSIS — R001 Bradycardia, unspecified: Principal | ICD-10-CM | POA: Insufficient documentation

## 2023-09-24 DIAGNOSIS — G8929 Other chronic pain: Secondary | ICD-10-CM | POA: Diagnosis not present

## 2023-09-24 DIAGNOSIS — J45909 Unspecified asthma, uncomplicated: Secondary | ICD-10-CM | POA: Diagnosis not present

## 2023-09-24 HISTORY — PX: PACEMAKER IMPLANT: EP1218

## 2023-09-24 LAB — CBC WITH DIFFERENTIAL/PLATELET
Abs Immature Granulocytes: 0.07 K/uL (ref 0.00–0.07)
Basophils Absolute: 0.1 K/uL (ref 0.0–0.1)
Basophils Relative: 1 %
Eosinophils Absolute: 0.3 K/uL (ref 0.0–0.5)
Eosinophils Relative: 2 %
HCT: 39.4 % (ref 39.0–52.0)
Hemoglobin: 12.9 g/dL — ABNORMAL LOW (ref 13.0–17.0)
Immature Granulocytes: 1 %
Lymphocytes Relative: 18 %
Lymphs Abs: 2.4 K/uL (ref 0.7–4.0)
MCH: 31.5 pg (ref 26.0–34.0)
MCHC: 32.7 g/dL (ref 30.0–36.0)
MCV: 96.3 fL (ref 80.0–100.0)
Monocytes Absolute: 1.5 K/uL — ABNORMAL HIGH (ref 0.1–1.0)
Monocytes Relative: 11 %
Neutro Abs: 8.9 K/uL — ABNORMAL HIGH (ref 1.7–7.7)
Neutrophils Relative %: 67 %
Platelets: 267 K/uL (ref 150–400)
RBC: 4.09 MIL/uL — ABNORMAL LOW (ref 4.22–5.81)
RDW: 20.1 % — ABNORMAL HIGH (ref 11.5–15.5)
WBC: 13.3 K/uL — ABNORMAL HIGH (ref 4.0–10.5)
nRBC: 0 % (ref 0.0–0.2)

## 2023-09-24 LAB — COMPREHENSIVE METABOLIC PANEL WITH GFR
ALT: 21 U/L (ref 0–44)
AST: 16 U/L (ref 15–41)
Albumin: 3.9 g/dL (ref 3.5–5.0)
Alkaline Phosphatase: 59 U/L (ref 38–126)
Anion gap: 10 (ref 5–15)
BUN: 27 mg/dL — ABNORMAL HIGH (ref 8–23)
CO2: 23 mmol/L (ref 22–32)
Calcium: 9.2 mg/dL (ref 8.9–10.3)
Chloride: 104 mmol/L (ref 98–111)
Creatinine, Ser: 1.26 mg/dL — ABNORMAL HIGH (ref 0.61–1.24)
GFR, Estimated: >60 mL/min (ref >60)
Glucose, Bld: 92 mg/dL (ref 70–99)
Potassium: 4.6 mmol/L (ref 3.5–5.1)
Sodium: 137 mmol/L (ref 135–145)
Total Bilirubin: 0.6 mg/dL (ref 0.0–1.2)
Total Protein: 6.8 g/dL (ref 6.5–8.1)

## 2023-09-24 LAB — ECHOCARDIOGRAM COMPLETE
Height: 71 in
S' Lateral: 3.5 cm
Weight: 2800 [oz_av]

## 2023-09-24 LAB — TSH: TSH: 1.039 u[IU]/mL (ref 0.350–4.500)

## 2023-09-24 LAB — T4, FREE: Free T4: 0.92 ng/dL (ref 0.61–1.12)

## 2023-09-24 LAB — TROPONIN I (HIGH SENSITIVITY)
Troponin I (High Sensitivity): 6 ng/L (ref <18)
Troponin I (High Sensitivity): 101 ng/L (ref <18)

## 2023-09-24 LAB — MAGNESIUM: Magnesium: 2.4 mg/dL (ref 1.7–2.4)

## 2023-09-24 SURGERY — PACEMAKER IMPLANT

## 2023-09-24 MED ORDER — HYDROCODONE-ACETAMINOPHEN 5-325 MG PO TABS
1.0000 | ORAL_TABLET | Freq: Four times a day (QID) | ORAL | Status: DC | PRN
  Administered 2023-09-24 – 2023-09-25 (×4): 1 via ORAL
  Filled 2023-09-24 (×3): qty 1

## 2023-09-24 MED ORDER — FENTANYL CITRATE (PF) 100 MCG/2ML IJ SOLN
INTRAMUSCULAR | Status: AC
  Filled 2023-09-24: qty 2

## 2023-09-24 MED ORDER — ACETAMINOPHEN 325 MG PO TABS: 325.0000 mg | ORAL_TABLET | ORAL | Status: DC | PRN

## 2023-09-24 MED ORDER — ASPIRIN 81 MG PO TBEC: 81.0000 mg | DELAYED_RELEASE_TABLET | Freq: Every day | ORAL | Status: DC

## 2023-09-24 MED ORDER — CHLORHEXIDINE GLUCONATE 4 % EX SOLN
60.0000 mL | Freq: Once | CUTANEOUS | Status: DC
  Filled 2023-09-24: qty 60

## 2023-09-24 MED ORDER — ONDANSETRON HCL 4 MG/2ML IJ SOLN
4.0000 mg | Freq: Four times a day (QID) | INTRAMUSCULAR | Status: DC | PRN
Start: 2023-09-24 — End: 2023-09-25

## 2023-09-24 MED ORDER — MIDAZOLAM HCL 2 MG/2ML IJ SOLN
INTRAMUSCULAR | Status: AC
  Filled 2023-09-24: qty 2

## 2023-09-24 MED ORDER — HYDROCODONE-ACETAMINOPHEN 5-325 MG PO TABS
1.0000 | ORAL_TABLET | Freq: Four times a day (QID) | ORAL | Status: DC | PRN
  Filled 2023-09-24 (×2): qty 1

## 2023-09-24 MED ORDER — LIDOCAINE HCL (PF) 1 % IJ SOLN
INTRAMUSCULAR | Status: DC | PRN
  Administered 2023-09-24: 30 mL

## 2023-09-24 MED ORDER — SODIUM CHLORIDE 0.9 % IV SOLN
80.0000 mg | INTRAVENOUS | Status: AC
  Administered 2023-09-24: 80 mg
  Filled 2023-09-24: qty 2

## 2023-09-24 MED ORDER — ENSURE PLUS HIGH PROTEIN PO LIQD: 237.0000 mL | Freq: Two times a day (BID) | ORAL | Status: DC

## 2023-09-24 MED ORDER — SODIUM CHLORIDE 0.9% FLUSH: 3.0000 mL | INTRAVENOUS | Status: DC | PRN

## 2023-09-24 MED ORDER — SODIUM CHLORIDE 0.9 % IV SOLN
INTRAVENOUS | Status: AC
  Filled 2023-09-24: qty 2

## 2023-09-24 MED ORDER — LIDOCAINE HCL 1 % IJ SOLN
INTRAMUSCULAR | Status: AC
  Filled 2023-09-24: qty 20

## 2023-09-24 MED ORDER — VITAMIN B-12 100 MCG PO TABS
100.0000 ug | ORAL_TABLET | Freq: Every day | ORAL | Status: DC
Start: 2023-09-25 — End: 2023-09-25
  Administered 2023-09-25: 100 ug via ORAL
  Filled 2023-09-24: qty 1

## 2023-09-24 MED ORDER — MIDAZOLAM HCL 5 MG/5ML IJ SOLN
INTRAMUSCULAR | Status: DC | PRN
  Administered 2023-09-24 (×2): 1 mg via INTRAVENOUS

## 2023-09-24 MED ORDER — SODIUM CHLORIDE 0.9 % IV SOLN: INTRAVENOUS | Status: DC

## 2023-09-24 MED ORDER — SODIUM CHLORIDE 0.9% FLUSH: 3.0000 mL | Freq: Two times a day (BID) | INTRAVENOUS | Status: DC

## 2023-09-24 MED ORDER — LISINOPRIL 10 MG PO TABS
10.0000 mg | ORAL_TABLET | Freq: Every day | ORAL | Status: DC
  Administered 2023-09-25: 10 mg via ORAL
  Filled 2023-09-24: qty 1

## 2023-09-24 MED ORDER — CEFAZOLIN SODIUM-DEXTROSE 2-4 GM/100ML-% IV SOLN
INTRAVENOUS | Status: AC
  Administered 2023-09-24: 2 g via INTRAVENOUS
  Filled 2023-09-24: qty 100

## 2023-09-24 MED ORDER — HEPARIN (PORCINE) IN NACL 1000-0.9 UT/500ML-% IV SOLN
INTRAVENOUS | Status: DC | PRN
  Administered 2023-09-24: 500 mL

## 2023-09-24 MED ORDER — CEFAZOLIN SODIUM-DEXTROSE 2-4 GM/100ML-% IV SOLN: 2.0000 g | INTRAVENOUS | Status: AC

## 2023-09-24 MED ORDER — LIDOCAINE HCL 1 % IJ SOLN
INTRAMUSCULAR | Status: AC
  Filled 2023-09-24: qty 60

## 2023-09-24 MED ORDER — FENTANYL CITRATE (PF) 100 MCG/2ML IJ SOLN
INTRAMUSCULAR | Status: DC | PRN
  Administered 2023-09-24 (×2): 25 ug via INTRAVENOUS

## 2023-09-24 MED ORDER — VITAMIN D 25 MCG (1000 UNIT) PO TABS
1000.0000 [IU] | ORAL_TABLET | Freq: Every day | ORAL | Status: DC
  Administered 2023-09-25: 1000 [IU] via ORAL
  Filled 2023-09-24: qty 1

## 2023-09-24 SURGICAL SUPPLY — 14 items
CABLE SURGICAL S-101-97-12 (CABLE) ×1 IMPLANT
CATH CPS LOCATOR 3D MED (CATHETERS) IMPLANT
LEAD ULTIPACE 52 LPA1231/52 (Lead) IMPLANT
LEAD ULTIPACE 65 LPA1231/65 (Lead) IMPLANT
PACEMAKER ASSURITY DR-RF (Pacemaker) IMPLANT
PAD DEFIB RADIO PHYSIO CONN (PAD) ×1 IMPLANT
POUCH AIGIS-R ANTIBACT ICD LRG (Mesh General) IMPLANT
SHEATH 7FR PRELUDE SNAP 13 (SHEATH) IMPLANT
SHEATH 9FR PRELUDE SNAP 13 (SHEATH) IMPLANT
SHEATH PROBE COVER 6X72 (BAG) IMPLANT
SLITTER AGILIS HISPRO (INSTRUMENTS) IMPLANT
TOOL HELIX LOCKING (MISCELLANEOUS) IMPLANT
TRAY PACEMAKER INSERTION (PACKS) ×1 IMPLANT
WIRE HI TORQ VERSACORE-J 145CM (WIRE) IMPLANT

## 2023-09-24 NOTE — ED Provider Notes (Signed)
 [place] EMERGENCY DEPARTMENT AT [facility] Provider Note   CSN: [id number] Arrival date & time: 09/24/23  1011     Patient presents with: Bradycardia   Kevin Bartlett. is a 73 y.o. male.   HPI     73yo male with history of hypertension, hyperlipidemia, chronic pain, tobacco abuse who presents with concern for symptomatic bradycardia from the Cardiology office.  A few months of pulse running low Lost 50lb a year ago, stopped the cardizem then  PCP stopped amitriptyline 6/26 due to bradycardia  Since they stopped that he is not sleeping well, heart rate low and feeling fatigue   A little lightheadedness but not a whole lot  No chest pain or dyspnea  No syncope  Every now and then gets pain in back, just an uncomfortable feeling, has some back issues might be that. Has it now. 3/10. Feet numb every now and then but no acut numbness or weakness No headache   Prior to Admission medications   Medication Sig Start Date End Date Taking? Authorizing Provider  acetaminophen  (TYLENOL ) 325 MG tablet Take 1-2 tablets (325-650 mg total) by mouth every 4 (four) hours as needed for mild pain (pain score 1-3). 09/25/23   Lesia Ozell Barter, PA-C  amitriptyline (ELAVIL) 75 MG tablet Take by mouth at bedtime.    [provider]  aspirin  81 MG tablet Take 81 mg by mouth daily.    [provider]  cholecalciferol  (VITAMIN D ) 1000 units tablet Take 1,000 Units by mouth daily.    [provider]  HYDROcodone -acetaminophen  (NORCO) 5-325 MG tablet Take 1 tablet by mouth every 6 (six) hours as needed for moderate pain. 05/15/16   Murrell Kuba, MD  meloxicam (MOBIC) 7.5 MG tablet Take 7.5 mg by mouth daily.    [provider]  quinapril (ACCUPRIL) 10 MG tablet Take 10 mg by mouth at bedtime.    [provider]  UNABLE TO FIND PK stimulant    [provider]  vitamin B-12 (CYANOCOBALAMIN ) 100 MCG tablet Take 100 mcg by mouth  daily.    [provider]    Allergies: Patient has no known allergies.    Review of Systems  Updated Vital Signs BP (!) 148/72 (BP Location: Right Arm)   Pulse 67   Temp 97.6 F (36.4 C) (Oral)   Resp 17   Ht 5' 11 (1.803 m)   Wt 78.3 kg   SpO2 98%   BMI 24.08 kg/m   Physical Exam Vitals and nursing note reviewed.  Constitutional:      General: He is not in acute distress.    Appearance: He is well-developed. He is not diaphoretic.  HENT:     Head: Normocephalic and atraumatic.  Eyes:     Conjunctiva/sclera: Conjunctivae normal.  Cardiovascular:     Rate and Rhythm: Regular rhythm. Bradycardia present.     Heart sounds: Normal heart sounds. No murmur heard.    No friction rub. No gallop.  Pulmonary:     Effort: Pulmonary effort is normal. No respiratory distress.     Breath sounds: Normal breath sounds. No wheezing or rales.  Abdominal:     General: There is no distension.     Palpations: Abdomen is soft.     Tenderness: There is no abdominal tenderness. There is no guarding.  Musculoskeletal:     Cervical back: Normal range of motion.  Skin:    General: Skin is warm and dry.  Neurological:  Mental Status: He is alert and oriented to person, place, and time.     (all labs ordered are listed, but only abnormal results are displayed) Labs Reviewed  CBC WITH DIFFERENTIAL/PLATELET - Abnormal; Notable for the following components:      Result Value   WBC 13.3 (*)    RBC 4.09 (*)    Hemoglobin 12.9 (*)    RDW 20.1 (*)    Neutro Abs 8.9 (*)    Monocytes Absolute 1.5 (*)    All other components within normal limits  COMPREHENSIVE METABOLIC PANEL WITH GFR - Abnormal; Notable for the following components:   BUN 27 (*)    Creatinine, Ser 1.26 (*)    All other components within normal limits  TROPONIN I (HIGH SENSITIVITY) - Abnormal; Notable for the following components:   Troponin I (High Sensitivity) 101 (*)    All other components within normal  limits  SURGICAL PCR SCREEN  MAGNESIUM  TSH  T4, FREE  TROPONIN I (HIGH SENSITIVITY)    EKG: EKG Interpretation Date/Time:  Tuesday September 24 2023 10:52:24 EDT Ventricular Rate:  34 PR Interval:  218 QRS Duration:  104 QT Interval:  497 QTC Calculation: 374 R Axis:   31  Text Interpretation: Predominant 2:1 AV block Minimal ST depression, anterolateral leads No significant change since last tracing Confirmed by Dreama Longs ([id number]) on 09/24/2023 12:27:35 PM  Radiology: [facility] Chest 2 View Result Date: 09/25/2023 CLINICAL DATA:  [id number]. Cardiac device in situ, other, with pacemaker insertion yesterday. EXAM: CHEST - 2 VIEW COMPARISON:  Preoperative portable chest yesterday at 11:31 a.m. FINDINGS: AP Lat chest series 5:04 a.m. A left chest dual lead pacing system has been inserted. One of the wires terminates in the plane of the right atrium and the other is believed to be in the proximal right ventricle. There is no pneumothorax. No pleural effusion is seen. The lungs are clear. There is mild cardiomegaly without evidence of CHF. Mild aortic uncoiling and atherosclerosis with otherwise unremarkable mediastinum. No new osseous findings. Moderate DJD both shoulders. Thoracic spondylosis. IMPRESSION: 1. Left chest dual lead pacing system insertion without evidence of pneumothorax or pleural effusion. 2. Mild cardiomegaly without evidence of CHF. 3. Aortic atherosclerosis. Electronically Signed   By: Francis Quam M.D.   On: 09/25/2023 06:56   EP PPM/ICD IMPLANT Result Date: 09/24/2023 SURGEON:  Soyla Norton, MD   PREPROCEDURE DIAGNOSIS:  second degree AV block   POSTPROCEDURE DIAGNOSIS:  second degree AV block    PROCEDURES:  1. Pacemaker implantation.  2. Left upper extremity venography.   INTRODUCTION:  Kevin Bartlett is a 73 y.o. male with a history of bradycardia who presents today for pacemaker implantation.  The patient reports intermittent episodes of dizziness over the past few months.  No  reversible causes have been identified.  The patient therefore presents today for pacemaker implantation.   DESCRIPTION OF PROCEDURE:  Informed written consent was obtained, and  the patient was brought to the electrophysiology lab in a fasting state.  The patient required no sedation for the procedure today.  The patients left chest was prepped and draped in the usual sterile fashion by the EP lab staff. The skin overlying the left deltopectoral region was infiltrated with lidocaine  for local analgesia.  A 4-cm incision was made over the left deltopectoral region.  A left subcutaneous pacemaker pocket was fashioned using a combination of sharp and blunt dissection. Electrocautery was required to assure hemostasis.  Left Upper Extremity Venography: A  venogram of the left upper extremity was performed, which revealed a large left cephalic vein, which emptied into a large left subclavian vein.  The left axillary vein was moderate in size.  RA/RV Lead Placement: The left axillary vein was therefore cannulated.  Through the left axillary vein, a Abbott Ultipace 1231-52  (serial number  [id number]) right atrial lead and an Abbott Ultipace 1231-65 (serial number  [id number]) right ventricular lead were advanced with fluoroscopic visualization into the right atrial appendage and left bundle area positions respectively.  Initial atrial lead P- waves measured 1.1 mV with impedance of 480 ohms and a threshold of 1 V at 0.5 msec.  Right ventricular lead R-waves measured 53.9 mV with an impedance of 640 ohms and a threshold of 1.25 V at 0.5 msec.  Both leads were secured to the pectoralis fascia using #2-0 silk over the suture sleeves. Device Placement:  The leads were then connected to an Abbott Assurity [id number]  (serial number  [id number] ) pacemaker.  The pocket was irrigated with copious gentamicin  solution.  The pacemaker was then placed into the pocket.  The pocket was then closed in 3 layers with 2.0 and 3.0 V-Loc suture for  the subcutaneous layers and 3.0 Vicryl suture for the subcuticular layers.  Steri-  Strips and a sterile dressing were then applied. EBL<26ml.  There were no early apparent complications.   CONCLUSIONS:  1. Successful implantation of a Abbott Assurity [id number] dual-chamber pacemaker for symptomatic bradycardia  2. No early apparent complications.   Will Inocencio, MD 09/24/2023 4:37 PM  ECHOCARDIOGRAM COMPLETE Result Date: 09/24/2023    ECHOCARDIOGRAM REPORT   Patient Name:   KEISHAWN RAJEWSKI Date of Exam: 09/24/2023 Medical Rec #:  [id number]        Height:       71.0 in Accession #:    [id number]       Weight:       175.0 lb [name]  08/04/1950         BSA:          1.992 m Patient Age:    73 years         BP:           188/56 mmHg Patient Gender: M                HR:           39 bpm. Exam Location:  Inpatient Procedure: 2D Echo, Color Doppler and Cardiac Doppler (Both Spectral and Color            Flow Doppler were utilized during procedure). Indications:    Heart Block 2nd degree i44.1  History:        Patient has no prior history of Echocardiogram examinations.                 Risk Factors:Hypertension and Sleep Apnea.  Sonographer:    Damien Senior RDCS Referring Phys: [id number] OZELL BARTER TILLERY IMPRESSIONS  1. Left ventricular ejection fraction, by estimation, is 55 to 60%. The left ventricle has normal function. The left ventricle has no regional wall motion abnormalities. There is moderate asymmetric left ventricular hypertrophy of the septal segment. Left ventricular diastolic parameters are indeterminate.  2. Right ventricular systolic function is normal. The right ventricular size is normal. Tricuspid regurgitation signal is inadequate for assessing PA pressure.  3. Left atrial size was mildly dilated.  4. Diastolic MR consistent with AV block. The mitral valve is normal  in structure. Mild mitral valve regurgitation.  5. Diastolic TR consistent with AV block.  6. The aortic valve was not well  visualized. Aortic valve regurgitation is trivial. Aortic valve sclerosis is present, with no evidence of aortic valve stenosis.  7. The inferior vena cava is dilated in size with >50% respiratory variability, suggesting right atrial pressure of 8 mmHg. Comparison(s): No prior Echocardiogram. FINDINGS  Left Ventricle: Left ventricular ejection fraction, by estimation, is 55 to 60%. The left ventricle has normal function. The left ventricle has no regional wall motion abnormalities. The left ventricular internal cavity size was normal in size. There is  moderate asymmetric left ventricular hypertrophy of the septal segment. Left ventricular diastolic parameters are indeterminate. Right Ventricle: The right ventricular size is normal. No increase in right ventricular wall thickness. Right ventricular systolic function is normal. Tricuspid regurgitation signal is inadequate for assessing PA pressure. Left Atrium: Left atrial size was mildly dilated. Right Atrium: Right atrial size was normal in size. Pericardium: There is no evidence of pericardial effusion. Mitral Valve: Diastolic MR consistent with AV block. The mitral valve is normal in structure. Mild mitral valve regurgitation. Tricuspid Valve: Diastolic TR consistent with AV block. The tricuspid valve is normal in structure. Tricuspid valve regurgitation is mild. Aortic Valve: The aortic valve was not well visualized. Aortic valve regurgitation is trivial. Aortic valve sclerosis is present, with no evidence of aortic valve stenosis. Pulmonic Valve: The pulmonic valve was normal in structure. Pulmonic valve regurgitation is trivial. Aorta: The aortic root and ascending aorta are structurally normal, with no evidence of dilitation. Venous: The inferior vena cava is dilated in size with greater than 50% respiratory variability, suggesting right atrial pressure of 8 mmHg. IAS/Shunts: No atrial level shunt detected by color flow Doppler.  LEFT VENTRICLE PLAX 2D LVIDd:          4.90 cm LVIDs:         3.50 cm LV PW:         1.00 cm LV IVS:        1.40 cm LVOT diam:     2.30 cm LV SV:         119 LV SV Index:   60 LVOT Area:     4.15 cm  RIGHT VENTRICLE RV S prime:     14.40 cm/s TAPSE (M-mode): 2.8 cm LEFT ATRIUM             Index        RIGHT ATRIUM           Index LA diam:        4.90 cm 2.46 cm/m   RA Area:     17.60 cm LA Vol (A2C):   69.8 ml 35.04 ml/m  RA Volume:   42.60 ml  21.38 ml/m LA Vol (A4C):   70.4 ml 35.34 ml/m LA Biplane Vol: 70.1 ml 35.19 ml/m  AORTIC VALVE LVOT Vmax:   128.00 cm/s LVOT Vmean:  75.000 cm/s LVOT VTI:    0.286 m  AORTA Ao Root diam: 3.30 cm Ao Asc diam:  3.80 cm  SHUNTS Systemic VTI:  0.29 m Systemic Diam: 2.30 cm Stanly Leavens MD Electronically signed by Stanly Leavens MD Signature Date/Time: 09/24/2023/1:55:03 PM    Final    DG Chest Portable 1 View Result Date: 09/24/2023 CLINICAL DATA:  bradycardia, back pain EXAM: PORTABLE CHEST - 1 VIEW COMPARISON:  None available. FINDINGS: No focal airspace consolidation, pleural effusion, or pneumothorax. No cardiomegaly. Tortuous aorta with  aortic atherosclerosis. No acute fracture or destructive lesions. Multilevel thoracic osteophytosis. IMPRESSION: No acute cardiopulmonary abnormality. Electronically Signed   By: Rogelia Myers M.D.   On: 09/24/2023 11:43     Procedures   Medications Ordered in the ED  sodium chloride  0.9 % with gentamicin  (GARAMYCIN ) ADS Med (has no administration in time range)  gentamicin  (GARAMYCIN ) 80 mg in sodium chloride  0.9 % 500 mL irrigation (80 mg Irrigation Given 09/24/23 1622)  ceFAZolin  (ANCEF ) IVPB 2g/100 mL premix (0 g Intravenous Stopped 09/24/23 1554)                                     73yo male with history of hypertension, hyperlipidemia, chronic pain, tobacco abuse who presents with concern for symptomatic bradycardia from the Cardiology office.  EKG completed and evaluated by me shows 2:1 2nd degree AV block.    Labs completed  and evaluated by me show mild leukocytosis, mild anemia, normal TSH, normal troponin, TSH/free T4, no clinically significant electrolyte abnormalities.   Cardiology consulted and will admit for continued care, likely pacemaker placement.      Final diagnoses:  Second degree AV block    ED Discharge Orders          Ordered    acetaminophen  (TYLENOL ) 325 MG tablet  Every 4 hours PRN        09/25/23 9272               Dreama Longs, MD 09/25/23 2244

## 2023-09-24 NOTE — Discharge Instructions (Signed)
 After Your Pacemaker   You have a Abbott Pacemaker  ACTIVITY Do not lift your arm above shoulder height for 1 week after your procedure. After 7 days, you may progress as below.  You should remove your sling 24 hours after your procedure, unless otherwise instructed by your provider.     Tuesday October 01, 2023  Wednesday October 02, 2023 Thursday October 03, 2023 Friday October 04, 2023   Do not lift, push, pull, or carry anything over 10 pounds with the affected arm until 6 weeks (Tuesday November 05, 2023 ) after your procedure.   You may drive AFTER your wound check, unless you have been told otherwise by your provider.   Ask your healthcare provider when you can go back to work   INCISION/Dressing If you are on a blood thinner such as Coumadin, Xarelto, Eliquis, Plavix, or Pradaxa please confirm with your provider when this should be resumed.   If large square, outer bandage is left in place, this can be removed after 24 hours from your procedure. Do not remove steri-strips or glue as below.   If a PRESSURE DRESSING (a bulky dressing that usually goes up over your shoulder) was applied or left in place, please follow instructions given by your provider on when to return to have this removed.   Monitor your Pacemaker site for redness, swelling, and drainage. Call the device clinic at [phone] if you experience these symptoms or fever/chills.  If your incision is sealed with Steri-strips or staples, you may shower 7 days after your procedure or when told by your provider. Do not remove the steri-strips or let the shower hit directly on your site. You may wash around your site with soap and water.    If you were discharged in a sling, please do not wear this during the day more than 48 hours after your surgery unless otherwise instructed. This may increase the risk of stiffness and soreness in your shoulder.   Avoid lotions, ointments, or perfumes over your incision until it is  well-healed.  You may use a hot tub or a pool AFTER your wound check appointment if the incision is completely closed.  Pacemaker Alerts:  Some alerts are vibratory and others beep. These are NOT emergencies. Please call our office to let us  know. If this occurs at night or on weekends, it can wait until the next business day. Send a remote transmission.  If your device is capable of reading fluid status (for heart failure), you will be offered monthly monitoring to review this with you.   DEVICE MANAGEMENT Remote monitoring is used to monitor your pacemaker from home. This monitoring is scheduled every 91 days by our office. It allows us  to keep an eye on the functioning of your device to ensure it is working properly. You will routinely see your Electrophysiologist annually (more often if necessary).   You should receive your ID card for your new device in 4-8 weeks. Keep this card with you at all times once received. Consider wearing a medical alert bracelet or necklace.  Your Pacemaker may be MRI compatible. This will be discussed at your next office visit/wound check.  You should avoid contact with strong electric or magnetic fields.   Do not use amateur (ham) radio equipment or electric (arc) welding torches. MP3 player headphones with magnets should not be used. Some devices are safe to use if held at least 12 inches (30 cm) from your Pacemaker. These include power tools, lawn  mowers, and speakers. If you are unsure if something is safe to use, ask your health care provider.  When using your cell phone, hold it to the ear that is on the opposite side from the Pacemaker. Do not leave your cell phone in a pocket over the Pacemaker.  You may safely use electric blankets, heating pads, computers, and microwave ovens.  Call the office right away if: You have chest pain. You feel more short of breath than you have felt before. You feel more light-headed than you have felt before. Your  incision starts to open up.  This information is not intended to replace advice given to you by your health care provider. Make sure you discuss any questions you have with your health care provider.

## 2023-09-24 NOTE — Plan of Care (Signed)

## 2023-09-24 NOTE — Progress Notes (Signed)
 Patient called for a Norco pain pill. Patient states he takes his pain medication every 4 hours at home. Explained to patient that in the hospital his pain medication is ordered every 6 hours. Patient became ill and began cursing at this nurse. Acknowledged patients frustration and explained that at midnight he can have another dose of pain medication. Patient asked for help positioning his covers, as I was helping patient with his covers he began cursing at this nurse again. Informed the patient that I'm willing to help him, but his form of communication and cursing at staff is not tolerated. I excused myself from patients room and informed him when he can communicate his needs without cursing ill be happy to come back and help meet his needs. Patient verbalizes understanding.

## 2023-09-24 NOTE — Progress Notes (Signed)
 Echocardiogram 2D Echocardiogram has been performed.  Damien FALCON Revan Gendron RDCS 09/24/2023, 11:24 AM

## 2023-09-24 NOTE — H&P (Addendum)
 ELECTROPHYSIOLOGY CONSULT NOTE    Patient ID: Kevin Bartlett MRN: [id number], DOB/AGE: 73/04/1950 73 y.o.  Admit date: 09/24/2023 Date of Consult: 09/24/2023  Primary Physician: Auston Opal, DO Primary Cardiologist: Peter Swaziland, MD  Electrophysiologist: New   Referring Provider: Dr. Dreama  Patient Profile: Kevin Bartlett is a 73 y.o. male with a history of HTN, HLD, chronic pain syndrome, and tobacco abuse who is being seen today for the evaluation of new bradycardia at the request of Dr. Dreama.  HPI:  Kevin Bartlett is a 73 y.o. male with medical history as above.   Pt saw PCP 09/12/23 with fatigue found to have bradycardia with HR 32 and possible 2:1 AV block. PCP discontinued amitriptyline. Cardizem still listed on med list. Pt urgently referred to cardiology.   Seen in office today with continued fatigue. States he has had 4-6 months of fatigue, but HTN and bradycardia for that last few weeks. Played Golf 10 days OK and did ok. Noted to be in 2:1 block with HRs in 30s, and sent to ED for EP consultation after discussion with DOD and Dr. Inocencio.   Feeling OK at rest currently, Smokes 4-5 cigars a day and has long cigarette history as well. Denies CP or SOB. No syncope.   Labs Cr and electrolytes pending at time of exam PLT  267 (07/08 1027) HGB  12.9* (07/08 1027) WBC 13.3* (07/08 1027)  .    Past Medical History:  Diagnosis Date   Arthritis    back, knees   Asthma    Depression    Erectile dysfunction `   HTN (hypertension)    OSA (obstructive sleep apnea)    uses CPAP most of time   Spinal stenosis      Surgical History:  Past Surgical History:  Procedure Laterality Date   APPENDECTOMY     CARPAL TUNNEL RELEASE Left 03/22/2016   Procedure: LEFT CARPAL TUNNEL RELEASE FASCIECTOMY LEFT RING FINGER;  Surgeon: Arley Curia, MD;  Location: [facility];  Service: Orthopedics;  Laterality: Left;  AXILLARY BLOCK   CARPAL TUNNEL RELEASE Right  05/15/2016   Procedure: RIGHT CARPAL TUNNEL RELEASE;  Surgeon: Arley Curia, MD;  Location: [facility];  Service: Orthopedics;  Laterality: Right;   FASCIECTOMY Left 03/22/2016   Procedure: FASCIECTOMY LEFT RING FINGER;  Surgeon: Arley Curia, MD;  Location: [facility];  Service: Orthopedics;  Laterality: Left;   INGUINAL HERNIA REPAIR Bilateral      (Not in a hospital admission)   Inpatient Medications:   Allergies: No Known Allergies  Family History  Problem Relation Age of Onset   Heart disease Father      Physical Exam: Vitals:   09/24/23 1030 09/24/23 1045 09/24/23 1100 09/24/23 1115  BP: (!) 175/60 (!) 184/68 (!) 171/68 (!) 176/65  Pulse: (!) 39 (!) 39 (!) 35 (!) 36  Resp: 15 (!) 23 (!) 22 17  Temp:      TempSrc:      SpO2: 99% 100% 100% 99%  Weight: 79.4 kg     Height: 5' 11 (1.803 m)       GEN- NAD, A&O x 3, normal affect HEENT: Normocephalic, atraumatic Lungs- CTAB, Normal effort.  Heart- Regular rate and rhythm, No M/G/R.  GI- Soft, NT, ND.  Extremities- No clubbing, cyanosis, or edema   Radiology/Studies: No results found.  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  Echo looks OK but formal read pending Trop pending Explained risks, benefits, and alternatives to PPM implantation, including but not limited to bleeding, infection, pneumothorax, pericardial effusion, lead dislodgement, heart attack, stroke, or death.  Pt verbalized understanding and agrees to proceed.     If EF down or troponin significant may require ischemic work up, but suspect conduction disease is primary issue.   Tentatively plan pacing this afternoon pending work up and lab availability.   HTN Likely in setting of HB. Follow for now.  Avoid AV nodal  agents prior to pacing.   Reena Borromeo admit to tele and plan pacing as early as this afternoon.   For questions or updates, please contact [facility] Please consult [email or web address] for contact info under     Signed, Ozell Prentice Passey, PA-C  09/24/2023, 11:38 AM       I have seen and examined this patient with Jodie Passey.  Agree with above, note added to reflect my findings. He has a history as above.  He presented to cardiology clinic complaining of fatigue.  His fatigue has been going on for 4 to 6 months.  He has noted bradycardia and difficult to control blood pressures over the last few weeks.  He played golf 10 days ago and did okay without major issue.  On presentation to cardiology clinic, he was found to be in 2-1 AV block.  He was thus sent to the emergency room.  Or shortness of breath at rest, but is not fatigue and shortness of breath with exertion.  GEN: No acute distress.   Neck: No JVD Cardiac: Bradycardic Respiratory: normal BS bases bilaterally. GI: Soft, nontender, non-distended  MS: No edema; No deformity. Neuro:  Nonfocal  Skin: warm and dry,  Psych: Normal affect    2-1 second-degree AV block: Patient has fatigue and weakness.  No reversible causes formal echo read pending.  Lori Popowski plan for pacemaker implant risks and benefits have been discussed.  He understands the risks and is agreed to the procedure. Hypertension: In the setting of heart block.  Jaevion Goto follow for now.  May need to start as an outpatient. Explained risks, benefits, and alternatives to PPM implantation, including but not limited to bleeding, infection, pneumothorax, pericardial effusion, lead dislodgement, heart attack, stroke, or death.  Pt verbalized understanding and agrees to proceed.   Trease Bremner M. Dasean Brow MD 09/24/2023 1:53 PM

## 2023-09-24 NOTE — ED Triage Notes (Signed)
 Pt BIB [facility] from cardiac clinic d/t them noticing pt was brady. PA at clinic reports he was in 2nd degree AV block & feeling weak. EMS reports he remained asymptomatic fpr them, A/Ox4, rates ranging from 32/34 occasionally going to the 60's then back down  to the 30's with occasional PVC's. Pt reports Hx of being on Cardizem a yr ago but unaware why he was taken off it, denies CP or SOB.

## 2023-09-24 NOTE — Progress Notes (Addendum)
 1707 PPM CDI 1730 patient arrived from cath lab alert x4 able to make all needs known on room air PPM to left chest CDI when getting up to urinate small about of blood noted to PPM site .05x.05 circled  1745 PPM drainage remains the same  1800 patient using arm pulling off BP cuff and adjusting self in bed educated again on the serious ness not to use that arm and to call for assistance Blood on PPM noted to now be 1x1 circled 1828 PPM drainage remains the same  1852 PPM drainage remains the same

## 2023-09-24 NOTE — ED Notes (Signed)
 ECHO at bedside.

## 2023-09-24 NOTE — Addendum Note (Signed)
 Addended by: MADIE JON SAILOR on: 09/24/2023 10:09 AM   Modules accepted: Level of Service

## 2023-09-25 ENCOUNTER — Observation Stay (HOSPITAL_COMMUNITY)

## 2023-09-25 DIAGNOSIS — J45909 Unspecified asthma, uncomplicated: Secondary | ICD-10-CM | POA: Diagnosis not present

## 2023-09-25 DIAGNOSIS — I10 Essential (primary) hypertension: Secondary | ICD-10-CM | POA: Diagnosis not present

## 2023-09-25 DIAGNOSIS — I441 Atrioventricular block, second degree: Secondary | ICD-10-CM | POA: Diagnosis not present

## 2023-09-25 DIAGNOSIS — R001 Bradycardia, unspecified: Secondary | ICD-10-CM | POA: Diagnosis not present

## 2023-09-25 DIAGNOSIS — G8929 Other chronic pain: Secondary | ICD-10-CM | POA: Diagnosis not present

## 2023-09-25 MED ORDER — ACETAMINOPHEN 325 MG PO TABS: 325.0000 mg | ORAL_TABLET | ORAL | Status: AC | PRN

## 2023-09-25 NOTE — Discharge Summary (Addendum)
 ELECTROPHYSIOLOGY PROCEDURE DISCHARGE SUMMARY    Patient ID: Kevin Bartlett,  MRN: [id number], DOB/AGE: 73/04/1950 73 y.o.  Admit date: 09/24/2023 Discharge date: 09/25/2023  Primary Care Physician: Auston Opal, DO  Primary Cardiologist: Peter Swaziland, MD  Electrophysiologist: Dr. Inocencio   Primary Discharge Diagnosis:  Symptomatic bradycardia status post pacemaker implantation this admission  Secondary Discharge Diagnosis:  Chronic pain  No Known Allergies   Procedures This Admission:  1.  Implantation of a Abbott Dual Chamber PPM on 09/24/2023 by Dr. Inocencio. The patient received a Abbott Assurity [id number] with a Abbott Ultipace 1231-52 right atrial lead and a Abbott Ultipace 1231-65 right ventricular lead.  There were no immediate post procedure complications.   2.  CXR on 09/25/2023 demonstrated no pneumothorax status post device implantation.       Brief HPI: Kevin Bartlett is a 73 y.o. male was admitted for symptomatic bradycardia and electrophysiology team asked to see for consideration of PPM implantation.  Past medical history includes above.  The patient has had AV block without reversible causes identified.  Risks, benefits, and alternatives to PPM implantation were reviewed with the patient who wished to proceed.   Hospital Course:  The patient was admitted and underwent implantation of a Abbott dual chamber PPM with details as outlined above.  He was monitored on telemetry overnight which demonstrated appropriate pacing.  Left chest was without hematoma or ecchymosis.  The device was interrogated and found to be functioning normally.  CXR was obtained and demonstrated no pneumothorax status post device implantation.  Wound care, arm mobility, and restrictions were reviewed with the patient.  The patient was examined and considered stable for discharge to home.    Anticoagulation resumption This patient is not on anticoagulation     Physical Exam: Vitals:    09/24/23 1801 09/24/23 2041 09/24/23 2340 09/25/23 0515  BP: 128/86 132/73 (!) 163/99 (!) 148/72  Pulse: 68 60 67 67  Resp:  19 16 18   Temp:  97.6 F (36.4 C) (!) 97.5 F (36.4 C)   TempSrc:  Oral Oral   SpO2: 96% 95% 98%   Weight:      Height:        GEN- NAD. A&O x 3.  HEENT: Normocephalic, atraumatic Lungs- CTAB, Normal effort.  Heart- RRR, No M/G/R.  GI- Soft, NT, ND.  Extremities- No clubbing, cyanosis, or edema;  Skin- warm and dry, no rash or lesion, left chest without hematoma/ecchymosis  Discharge Medications:  Allergies as of 09/25/2023   No Known Allergies      Medication List     TAKE these medications    acetaminophen  325 MG tablet Commonly known as: TYLENOL  Take 1-2 tablets (325-650 mg total) by mouth every 4 (four) hours as needed for mild pain (pain score 1-3).   amitriptyline 75 MG tablet Commonly known as: ELAVIL Take by mouth at bedtime.   aspirin  81 MG tablet Take 81 mg by mouth daily.   cholecalciferol  1000 units tablet Commonly known as: VITAMIN D  Take 1,000 Units by mouth daily.   HYDROcodone -acetaminophen  5-325 MG tablet Commonly known as: Norco Take 1 tablet by mouth every 6 (six) hours as needed for moderate pain.   meloxicam 7.5 MG tablet Commonly known as: MOBIC Take 7.5 mg by mouth daily.   quinapril 10 MG tablet Commonly known as: ACCUPRIL Take 10 mg by mouth at bedtime.   UNABLE TO FIND PK stimulant   vitamin B-12 100 MCG tablet Commonly known as: CYANOCOBALAMIN   Take 100 mcg by mouth daily.        Disposition:  Home with usual follow up as in AVS   Duration of Discharge Encounter:  APP time: 27 minutes  Signed, Ozell Prentice Passey, PA-C  09/25/2023 7:27 AM   I have seen and examined this patient with Jodie Passey.  Agree with above, note added to reflect my findings.  On exam, regular rhythm, no murmurs.  She is now status post Abbott dual-chamber pacemaker implanted for second-degree heart block.  Device  functioning appropriately.  Sensing, threshold, impedance within normal limits as expected post implant.  Chest x-ray and interrogation without issue.  Plan for discharge today with follow-up in device clinic.  Diane Mochizuki M. Perseus Westall MD 09/25/2023 8:22 AM

## 2023-09-27 ENCOUNTER — Telehealth: Payer: Self-pay

## 2023-09-27 MED FILL — Midazolam HCl Inj 2 MG/2ML (Base Equivalent): INTRAMUSCULAR | Qty: 2 | Status: AC

## 2023-09-27 NOTE — Telephone Encounter (Signed)
 Follow-up after same day discharge: Implant date: 7/8/205 MD: Soyla Norton, MD Device: ABBOTT PACEMAKER  Location: LEFT CHEST   Wound check visit: 10/10/2023 90 day MD follow-up: 12/30/2023  Remote Transmission received:09/25/2023  Dressing/sling removed: OFF  Confirm OAC restart on: NO OAC  Please continue to monitor your cardiac device site for redness, swelling, and drainage. Call the device clinic at [phone] if you experience these symptoms, fever/chills, or have questions about your device.   Remote monitoring is used to monitor your cardiac device from home. This monitoring is scheduled every 91 days by our office. It allows us  to keep an eye on the functioning of your device to ensure it is working properly.   The pt was doing well after gen change.

## 2023-10-02 ENCOUNTER — Telehealth: Payer: Self-pay

## 2023-10-02 NOTE — Telephone Encounter (Signed)
 Pt states he put a patch over his device and took a shower. He states the area still got a little damp. I told him to keep the area dry and keep an eye on it. If he see any swelling or bruising to call and let us  know.   He states he have notice some bruising on his breast. Its is a dark purplish color with some yellow tint. I told him some bruising is normal. I told him to continue to keep an eye and to call us  if things get worse.   The pt verbalized understanding.

## 2023-10-07 ENCOUNTER — Telehealth: Payer: Self-pay | Admitting: Cardiology

## 2023-10-07 NOTE — Telephone Encounter (Signed)
 Agree, no restrictions on the legs.  Just protecting the affected shoulder!

## 2023-10-07 NOTE — Telephone Encounter (Signed)
 Device clinic: Discharge instructions for PM 7/8 does not advise against walking. Please see pt msg and advise pt.   APP--Angela Duke out of office today, pt last saw this provider. Copying AT.

## 2023-10-07 NOTE — Telephone Encounter (Signed)
 Patient would like to know if he can go outside and take a walk post 7/08 PPM. Please advise.

## 2023-10-07 NOTE — Telephone Encounter (Signed)
 Spoke with patient and made him aware - okay to walk, just be careful and don't exert self in the heat.

## 2023-10-10 ENCOUNTER — Ambulatory Visit: Attending: Cardiology | Admitting: *Deleted

## 2023-10-10 DIAGNOSIS — I441 Atrioventricular block, second degree: Secondary | ICD-10-CM

## 2023-10-10 LAB — CUP PACEART INCLINIC DEVICE CHECK
Battery Remaining Longevity: 123 mo
Battery Voltage: 2.95 V
Brady Statistic RA Percent Paced: 18 %
Brady Statistic RV Percent Paced: 98 %
Date Time Interrogation Session: 20250724143623
Implantable Lead Connection Status: 753985
Implantable Lead Connection Status: 753985
Implantable Lead Implant Date: 20250708
Implantable Lead Implant Date: 20250708
Implantable Lead Location: 753859
Implantable Lead Location: 753860
Implantable Pulse Generator Implant Date: 20250708
Lead Channel Impedance Value: 437.5 Ohm
Lead Channel Impedance Value: 475 Ohm
Lead Channel Pacing Threshold Amplitude: 0.75 V
Lead Channel Pacing Threshold Amplitude: 0.75 V
Lead Channel Pacing Threshold Amplitude: 0.75 V
Lead Channel Pacing Threshold Amplitude: 0.75 V
Lead Channel Pacing Threshold Pulse Width: 0.5 ms
Lead Channel Pacing Threshold Pulse Width: 0.5 ms
Lead Channel Pacing Threshold Pulse Width: 0.5 ms
Lead Channel Pacing Threshold Pulse Width: 0.5 ms
Lead Channel Sensing Intrinsic Amplitude: 4.3 mV
Lead Channel Setting Pacing Amplitude: 1 V
Lead Channel Setting Pacing Amplitude: 1.625
Lead Channel Setting Pacing Pulse Width: 0.5 ms
Lead Channel Setting Sensing Sensitivity: 2 mV
Pulse Gen Model: 2272
Pulse Gen Serial Number: 8276130

## 2023-10-10 NOTE — Patient Instructions (Signed)

## 2023-10-10 NOTE — Progress Notes (Signed)
 Normal dual chamber pacemaker wound check. Presenting rhythm: AS/VP. Wound well healed. Routine testing performed. Thresholds, sensing, and impedances consistent with implant measurements and at 3.5V safety margin/auto capture until 3 month visit. 1 AMS episode noted and determined to be brief A-tach 7 seconds in length. Patient denied experiencing any symptoms during event when asked. Reviewed arm restrictions to continue for 6 weeks total post op.  Pt enrolled in remote follow-up.

## 2023-10-14 ENCOUNTER — Ambulatory Visit: Payer: Self-pay | Admitting: Cardiology

## 2023-11-08 ENCOUNTER — Ambulatory Visit (INDEPENDENT_AMBULATORY_CARE_PROVIDER_SITE_OTHER)

## 2023-11-08 ENCOUNTER — Ambulatory Visit: Payer: Self-pay | Admitting: Cardiology

## 2023-11-08 DIAGNOSIS — I441 Atrioventricular block, second degree: Secondary | ICD-10-CM

## 2023-11-08 LAB — CUP PACEART REMOTE DEVICE CHECK
Battery Remaining Longevity: 121 mo
Battery Remaining Percentage: 95.5 %
Battery Voltage: 3.02 V
Brady Statistic AP VP Percent: 13 %
Brady Statistic AP VS Percent: 1 %
Brady Statistic AS VP Percent: 87 %
Brady Statistic AS VS Percent: 1 %
Brady Statistic RA Percent Paced: 13 %
Brady Statistic RV Percent Paced: 99 %
Date Time Interrogation Session: 20250821105822
Implantable Lead Connection Status: 753985
Implantable Lead Connection Status: 753985
Implantable Lead Implant Date: 20250708
Implantable Lead Implant Date: 20250708
Implantable Lead Location: 753859
Implantable Lead Location: 753860
Implantable Pulse Generator Implant Date: 20250708
Lead Channel Impedance Value: 430 Ohm
Lead Channel Impedance Value: 450 Ohm
Lead Channel Pacing Threshold Amplitude: 0.625 V
Lead Channel Pacing Threshold Amplitude: 0.75 V
Lead Channel Pacing Threshold Pulse Width: 0.5 ms
Lead Channel Pacing Threshold Pulse Width: 0.5 ms
Lead Channel Sensing Intrinsic Amplitude: 3.9 mV
Lead Channel Sensing Intrinsic Amplitude: 7 mV
Lead Channel Setting Pacing Amplitude: 1 V
Lead Channel Setting Pacing Amplitude: 1.625
Lead Channel Setting Pacing Pulse Width: 0.5 ms
Lead Channel Setting Sensing Sensitivity: 2 mV
Pulse Gen Model: 2272
Pulse Gen Serial Number: 8276130

## 2023-11-11 DIAGNOSIS — I10 Essential (primary) hypertension: Secondary | ICD-10-CM | POA: Diagnosis not present

## 2023-11-11 DIAGNOSIS — N1831 Chronic kidney disease, stage 3a: Secondary | ICD-10-CM | POA: Diagnosis not present

## 2023-11-17 DIAGNOSIS — N1831 Chronic kidney disease, stage 3a: Secondary | ICD-10-CM | POA: Diagnosis not present

## 2023-11-17 DIAGNOSIS — E782 Mixed hyperlipidemia: Secondary | ICD-10-CM | POA: Diagnosis not present

## 2023-11-17 DIAGNOSIS — I10 Essential (primary) hypertension: Secondary | ICD-10-CM | POA: Diagnosis not present

## 2023-11-17 DIAGNOSIS — E781 Pure hyperglyceridemia: Secondary | ICD-10-CM | POA: Diagnosis not present

## 2023-12-10 NOTE — Progress Notes (Signed)
 Remote PPM Transmission

## 2023-12-11 DIAGNOSIS — I10 Essential (primary) hypertension: Secondary | ICD-10-CM | POA: Diagnosis not present

## 2023-12-11 DIAGNOSIS — N1831 Chronic kidney disease, stage 3a: Secondary | ICD-10-CM | POA: Diagnosis not present

## 2023-12-17 DIAGNOSIS — E782 Mixed hyperlipidemia: Secondary | ICD-10-CM | POA: Diagnosis not present

## 2023-12-17 DIAGNOSIS — I10 Essential (primary) hypertension: Secondary | ICD-10-CM | POA: Diagnosis not present

## 2023-12-17 DIAGNOSIS — N1831 Chronic kidney disease, stage 3a: Secondary | ICD-10-CM | POA: Diagnosis not present

## 2023-12-17 DIAGNOSIS — Z125 Encounter for screening for malignant neoplasm of prostate: Secondary | ICD-10-CM | POA: Diagnosis not present

## 2023-12-17 DIAGNOSIS — Z1331 Encounter for screening for depression: Secondary | ICD-10-CM | POA: Diagnosis not present

## 2023-12-17 DIAGNOSIS — Z0000 Encounter for general adult medical examination without abnormal findings: Secondary | ICD-10-CM | POA: Diagnosis not present

## 2023-12-17 DIAGNOSIS — Z131 Encounter for screening for diabetes mellitus: Secondary | ICD-10-CM | POA: Diagnosis not present

## 2023-12-17 DIAGNOSIS — G894 Chronic pain syndrome: Secondary | ICD-10-CM | POA: Diagnosis not present

## 2023-12-17 DIAGNOSIS — I495 Sick sinus syndrome: Secondary | ICD-10-CM | POA: Diagnosis not present

## 2023-12-17 DIAGNOSIS — E781 Pure hyperglyceridemia: Secondary | ICD-10-CM | POA: Diagnosis not present

## 2023-12-30 ENCOUNTER — Encounter: Payer: Self-pay | Admitting: Student

## 2023-12-30 ENCOUNTER — Ambulatory Visit: Attending: Student | Admitting: Student

## 2023-12-30 ENCOUNTER — Ambulatory Visit: Payer: Self-pay | Admitting: Cardiology

## 2023-12-30 VITALS — BP 110/62 | HR 72 | Ht 72.0 in | Wt 182.0 lb

## 2023-12-30 DIAGNOSIS — I441 Atrioventricular block, second degree: Secondary | ICD-10-CM | POA: Diagnosis not present

## 2023-12-30 DIAGNOSIS — I10 Essential (primary) hypertension: Secondary | ICD-10-CM | POA: Diagnosis not present

## 2023-12-30 LAB — CUP PACEART INCLINIC DEVICE CHECK
Battery Remaining Longevity: 120 mo
Battery Voltage: 3.01 V
Brady Statistic RA Percent Paced: 9.8 %
Brady Statistic RV Percent Paced: 99.71 %
Date Time Interrogation Session: 20251013102051
Implantable Lead Connection Status: 753985
Implantable Lead Connection Status: 753985
Implantable Lead Implant Date: 20250708
Implantable Lead Implant Date: 20250708
Implantable Lead Location: 753859
Implantable Lead Location: 753860
Implantable Pulse Generator Implant Date: 20250708
Lead Channel Impedance Value: 412.5 Ohm
Lead Channel Impedance Value: 437.5 Ohm
Lead Channel Pacing Threshold Amplitude: 0.625 V
Lead Channel Pacing Threshold Amplitude: 0.75 V
Lead Channel Pacing Threshold Pulse Width: 0.5 ms
Lead Channel Pacing Threshold Pulse Width: 0.5 ms
Lead Channel Sensing Intrinsic Amplitude: 5 mV
Lead Channel Sensing Intrinsic Amplitude: 9.1 mV
Lead Channel Setting Pacing Amplitude: 1 V
Lead Channel Setting Pacing Amplitude: 1.625
Lead Channel Setting Pacing Pulse Width: 0.5 ms
Lead Channel Setting Sensing Sensitivity: 2 mV
Pulse Gen Model: 2272
Pulse Gen Serial Number: 8276130

## 2023-12-30 NOTE — Progress Notes (Signed)
  Electrophysiology Office Note:   ID:  Kevin Johnie Jose Mickey., DOB 1950-05-12, MRN [id number]  Primary Cardiologist: Peter Swaziland, MD Electrophysiologist: Soyla Gladis Norton, MD      History of Present Illness:   Kevin Bartlett. is a 73 y.o. male with h/o HTN, HLD, chronic pain syndrome, second degree HB s/p PPM, and tobacco abuse  seen today for routine electrophysiology followup.   Since last being seen in our clinic the patient reports doing well. There was some confusion on Lisinopril  vs quinopril. Lisinopril  was topped at discharged post pacing, as quinopril was also listed, and at the time he thought that was the one he was taking. Has only lisinopril  at home. Otherwise, he denies chest pain, palpitations, dyspnea, PND, orthopnea, nausea, vomiting, dizziness, syncope, edema, weight gain, or early satiety.   Review of systems complete and found to be negative unless listed in HPI.   EP Information / Studies Reviewed:    EKG is ordered today. Personal review as below.  EKG Interpretation Date/Time:  Monday December 30 2023 10:01:10 EDT Ventricular Rate:  73 PR Interval:  200 QRS Duration:  154 QT Interval:  402 QTC Calculation: 442 R Axis:   -32  Text Interpretation: Atrial-sensed ventricular-paced rhythm When compared with ECG of 25-Sep-2023 05:21, Vent. rate has increased BY  12 BPM Confirmed by Lesia Sharper [phone]) on 12/30/2023 10:11:39 AM    PPM Interrogation-  reviewed in detail today,  See PACEART report.  Arrhythmia/Device History Abbott Dual Chamber PPM 09/24/2023 for Second degree AV block   Physical Exam:   VS:  BP 110/62 (BP Location: Right Arm, Patient Position: Sitting, Cuff Size: Normal)   Pulse 72   Ht 6' (1.829 m)   Wt 182 lb (82.6 kg)   SpO2 98%   BMI 24.68 kg/m    Wt Readings from Last 3 Encounters:  12/30/23 182 lb (82.6 kg)  09/24/23 172 lb 9.9 oz (78.3 kg)  09/24/23 175 lb 12.8 oz (79.7 kg)     GEN: No acute distress  NECK:  No JVD; No carotid bruits CARDIAC: Regular rate and rhythm, no murmurs, rubs, gallops RESPIRATORY:  Clear to auscultation without rales, wheezing or rhonchi  ABDOMEN: Soft, non-tender, non-distended EXTREMITIES:  No edema; No deformity   ASSESSMENT AND PLAN:    Second Degree AV block s/p Abbott PPM  Normal PPM function See Pace Art report No changes today  HTN Stable on current regimen  No preference between lisinopril  and quinopril from our perspective. Prior discontinuation was only to due med rec showing it as a duplicate therapy at the time.  Defer med choice to PCP.   Brief AT Asymptomatic. Follow burden on device.   Disposition:   Follow up with Dr. Norton in 12 months  Signed, Sharper Prentice Lesia, PA-C

## 2023-12-30 NOTE — Patient Instructions (Signed)
 Medication Instructions:  Your physician recommends that you continue on your current medications as directed. Please refer to the Current Medication list given to you today.  *If you need a refill on your cardiac medications before your next appointment, please call your pharmacy*  Lab Work: None ordered If you have labs (blood work) drawn today and your tests are completely normal, you will receive your results only by: MyChart Message (if you have MyChart) OR A paper copy in the mail If you have any lab test that is abnormal or we need to change your treatment, we will call you to review the results.  Follow-Up: At [facility], you and your health needs are our priority.  As part of our continuing mission to provide you with exceptional heart care, our providers are all part of one team.  This team includes your primary Cardiologist (physician) and Advanced Practice Providers or APPs (Physician Assistants and Nurse Practitioners) who all work together to provide you with the care you need, when you need it.  Your next appointment:   1 year(s)  Provider:   Agatha Horsfall, MD

## 2024-01-10 DIAGNOSIS — N1831 Chronic kidney disease, stage 3a: Secondary | ICD-10-CM | POA: Diagnosis not present

## 2024-01-10 DIAGNOSIS — I10 Essential (primary) hypertension: Secondary | ICD-10-CM | POA: Diagnosis not present

## 2024-01-17 DIAGNOSIS — N1831 Chronic kidney disease, stage 3a: Secondary | ICD-10-CM | POA: Diagnosis not present

## 2024-01-17 DIAGNOSIS — E781 Pure hyperglyceridemia: Secondary | ICD-10-CM | POA: Diagnosis not present

## 2024-01-17 DIAGNOSIS — E782 Mixed hyperlipidemia: Secondary | ICD-10-CM | POA: Diagnosis not present

## 2024-01-17 DIAGNOSIS — I10 Essential (primary) hypertension: Secondary | ICD-10-CM | POA: Diagnosis not present

## 2024-02-07 ENCOUNTER — Ambulatory Visit

## 2024-02-07 DIAGNOSIS — I441 Atrioventricular block, second degree: Secondary | ICD-10-CM | POA: Diagnosis not present

## 2024-02-09 DIAGNOSIS — N1831 Chronic kidney disease, stage 3a: Secondary | ICD-10-CM | POA: Diagnosis not present

## 2024-02-09 DIAGNOSIS — I10 Essential (primary) hypertension: Secondary | ICD-10-CM | POA: Diagnosis not present

## 2024-02-10 LAB — CUP PACEART REMOTE DEVICE CHECK
Battery Remaining Longevity: 116 mo
Battery Remaining Percentage: 95.5 %
Battery Voltage: 3.01 V
Brady Statistic AP VP Percent: 11 %
Brady Statistic AP VS Percent: 1 %
Brady Statistic AS VP Percent: 89 %
Brady Statistic AS VS Percent: 1 %
Brady Statistic RA Percent Paced: 11 %
Brady Statistic RV Percent Paced: 99 %
Date Time Interrogation Session: 20251121113606
Implantable Lead Connection Status: 753985
Implantable Lead Connection Status: 753985
Implantable Lead Implant Date: 20250708
Implantable Lead Implant Date: 20250708
Implantable Lead Location: 753859
Implantable Lead Location: 753860
Implantable Pulse Generator Implant Date: 20250708
Lead Channel Impedance Value: 430 Ohm
Lead Channel Impedance Value: 440 Ohm
Lead Channel Pacing Threshold Amplitude: 0.625 V
Lead Channel Pacing Threshold Amplitude: 0.75 V
Lead Channel Pacing Threshold Pulse Width: 0.5 ms
Lead Channel Pacing Threshold Pulse Width: 0.5 ms
Lead Channel Sensing Intrinsic Amplitude: 4.4 mV
Lead Channel Sensing Intrinsic Amplitude: 8.7 mV
Lead Channel Setting Pacing Amplitude: 1 V
Lead Channel Setting Pacing Amplitude: 1.625
Lead Channel Setting Pacing Pulse Width: 0.5 ms
Lead Channel Setting Sensing Sensitivity: 2 mV
Pulse Gen Model: 2272
Pulse Gen Serial Number: 8276130

## 2024-02-11 NOTE — Progress Notes (Signed)
 Remote PPM Transmission

## 2024-02-12 ENCOUNTER — Ambulatory Visit: Payer: Self-pay | Admitting: Cardiology

## 2024-02-16 DIAGNOSIS — I10 Essential (primary) hypertension: Secondary | ICD-10-CM | POA: Diagnosis not present

## 2024-02-16 DIAGNOSIS — N1831 Chronic kidney disease, stage 3a: Secondary | ICD-10-CM | POA: Diagnosis not present

## 2024-05-08 ENCOUNTER — Encounter

## 2024-08-07 ENCOUNTER — Encounter

## 2024-11-06 ENCOUNTER — Encounter

## 2025-02-05 ENCOUNTER — Encounter

## 2025-05-07 ENCOUNTER — Encounter

## 2025-08-06 ENCOUNTER — Encounter
